# Patient Record
Sex: Female | Born: 1970 | Race: White | Hispanic: No | Marital: Single | Smoking: Current every day smoker
Health system: Southern US, Community
[De-identification: ages and names within clinical notes are randomized; demographics above are authoritative.]

## PROBLEM LIST (undated history)

## (undated) DIAGNOSIS — T8859XA Other complications of anesthesia, initial encounter: Secondary | ICD-10-CM

## (undated) DIAGNOSIS — C801 Malignant (primary) neoplasm, unspecified: Secondary | ICD-10-CM

## (undated) DIAGNOSIS — F32A Depression, unspecified: Secondary | ICD-10-CM

## (undated) DIAGNOSIS — M1990 Unspecified osteoarthritis, unspecified site: Secondary | ICD-10-CM

## (undated) DIAGNOSIS — F419 Anxiety disorder, unspecified: Secondary | ICD-10-CM

## (undated) DIAGNOSIS — I10 Essential (primary) hypertension: Secondary | ICD-10-CM

## (undated) DIAGNOSIS — I739 Peripheral vascular disease, unspecified: Secondary | ICD-10-CM

---

## 1998-03-07 HISTORY — PX: BREAST SURGERY: SHX581

## 2003-04-29 ENCOUNTER — Other Ambulatory Visit: Admission: RE | Admit: 2003-04-29 | Discharge: 2003-04-29 | Payer: Self-pay | Admitting: Obstetrics and Gynecology

## 2004-06-25 ENCOUNTER — Other Ambulatory Visit: Admission: RE | Admit: 2004-06-25 | Discharge: 2004-06-25 | Payer: Self-pay | Admitting: Obstetrics and Gynecology

## 2005-12-05 ENCOUNTER — Encounter: Admission: RE | Admit: 2005-12-05 | Discharge: 2005-12-05 | Payer: Self-pay | Admitting: Obstetrics and Gynecology

## 2008-07-21 ENCOUNTER — Encounter: Admission: RE | Admit: 2008-07-21 | Discharge: 2008-07-21 | Payer: Self-pay | Admitting: Obstetrics and Gynecology

## 2009-11-06 ENCOUNTER — Encounter: Admission: RE | Admit: 2009-11-06 | Discharge: 2009-11-06 | Payer: Self-pay | Admitting: Obstetrics and Gynecology

## 2011-10-07 ENCOUNTER — Other Ambulatory Visit: Payer: Self-pay | Admitting: Obstetrics and Gynecology

## 2011-10-07 DIAGNOSIS — N644 Mastodynia: Secondary | ICD-10-CM

## 2011-10-11 ENCOUNTER — Ambulatory Visit
Admission: RE | Admit: 2011-10-11 | Discharge: 2011-10-11 | Disposition: A | Discharge status: Home / Self Care | Payer: No Typology Code available for payment source | Source: Ambulatory Visit | Attending: Obstetrics and Gynecology | Admitting: Obstetrics and Gynecology

## 2011-10-11 DIAGNOSIS — N644 Mastodynia: Secondary | ICD-10-CM

## 2013-11-06 ENCOUNTER — Other Ambulatory Visit: Payer: Self-pay | Admitting: Obstetrics and Gynecology

## 2013-11-06 DIAGNOSIS — N644 Mastodynia: Secondary | ICD-10-CM

## 2013-11-07 ENCOUNTER — Other Ambulatory Visit: Payer: Self-pay | Admitting: Obstetrics and Gynecology

## 2013-11-07 DIAGNOSIS — Z9189 Other specified personal risk factors, not elsewhere classified: Secondary | ICD-10-CM

## 2013-11-07 DIAGNOSIS — Z803 Family history of malignant neoplasm of breast: Secondary | ICD-10-CM

## 2013-11-14 ENCOUNTER — Ambulatory Visit
Admission: RE | Admit: 2013-11-14 | Discharge: 2013-11-14 | Disposition: A | Discharge status: Home / Self Care | Payer: BC Managed Care – PPO | Source: Ambulatory Visit | Attending: Obstetrics and Gynecology | Admitting: Obstetrics and Gynecology

## 2013-11-14 ENCOUNTER — Other Ambulatory Visit: Payer: Self-pay | Admitting: Obstetrics and Gynecology

## 2013-11-14 DIAGNOSIS — N644 Mastodynia: Secondary | ICD-10-CM

## 2015-06-17 DIAGNOSIS — F339 Major depressive disorder, recurrent, unspecified: Secondary | ICD-10-CM | POA: Insufficient documentation

## 2015-06-17 DIAGNOSIS — E782 Mixed hyperlipidemia: Secondary | ICD-10-CM | POA: Insufficient documentation

## 2016-06-30 ENCOUNTER — Other Ambulatory Visit: Payer: Self-pay | Admitting: Obstetrics and Gynecology

## 2016-06-30 DIAGNOSIS — N644 Mastodynia: Secondary | ICD-10-CM

## 2016-07-04 ENCOUNTER — Ambulatory Visit
Admission: RE | Admit: 2016-07-04 | Discharge: 2016-07-04 | Disposition: A | Discharge status: Home / Self Care | Payer: BLUE CROSS/BLUE SHIELD | Source: Ambulatory Visit | Attending: Obstetrics and Gynecology | Admitting: Obstetrics and Gynecology

## 2016-07-04 ENCOUNTER — Other Ambulatory Visit: Payer: Self-pay | Admitting: Obstetrics and Gynecology

## 2016-07-04 DIAGNOSIS — N644 Mastodynia: Secondary | ICD-10-CM

## 2016-07-05 ENCOUNTER — Other Ambulatory Visit: Payer: Self-pay

## 2016-09-06 DIAGNOSIS — E876 Hypokalemia: Secondary | ICD-10-CM | POA: Insufficient documentation

## 2017-01-30 DIAGNOSIS — N182 Chronic kidney disease, stage 2 (mild): Secondary | ICD-10-CM | POA: Insufficient documentation

## 2017-05-23 ENCOUNTER — Encounter: Payer: Self-pay | Admitting: Gastroenterology

## 2017-12-05 DIAGNOSIS — N903 Dysplasia of vulva, unspecified: Secondary | ICD-10-CM | POA: Insufficient documentation

## 2018-11-05 IMAGING — MG 2D DIGITAL DIAGNOSTIC BILATERAL MAMMOGRAM WITH IMPLANTS, CAD AND
9 of 16 series · 9 of 32 positions shown · non-contrast
Comparison: Previous exam(s).

CLINICAL DATA: 45-year-old female with retroareolar and outer right
breast pain and for annual mammograms.

EXAM:
2D DIGITAL DIAGNOSTIC BILATERAL MAMMOGRAM WITH IMPLANTS, CAD AND
ADJUNCT TOMO
ULTRASOUND RIGHT BREAST
The patient has retropectoral implants. Standard and implant
displaced views were performed.

[L CC]
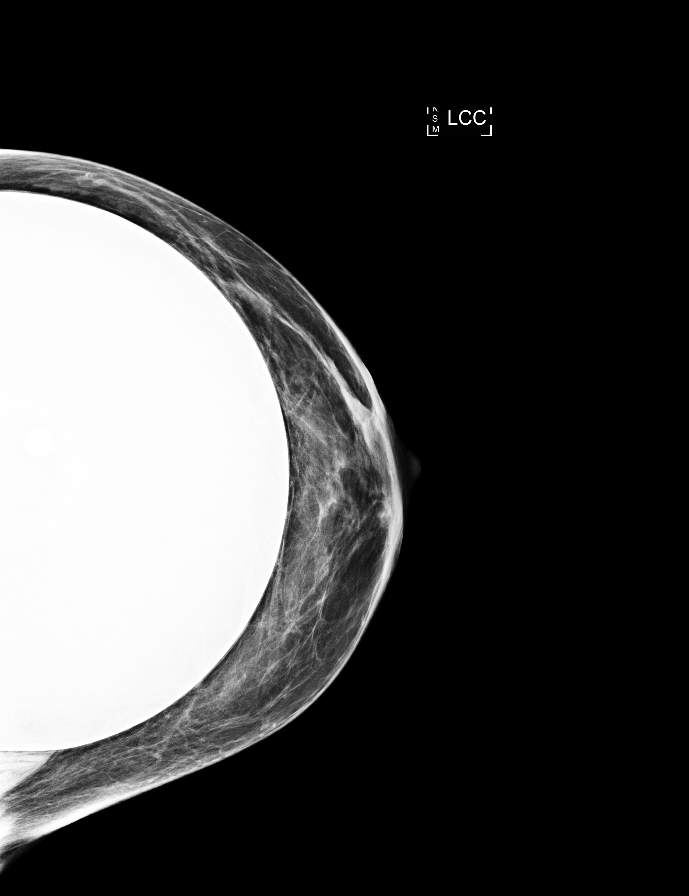

[L MLO (1 of 2)]
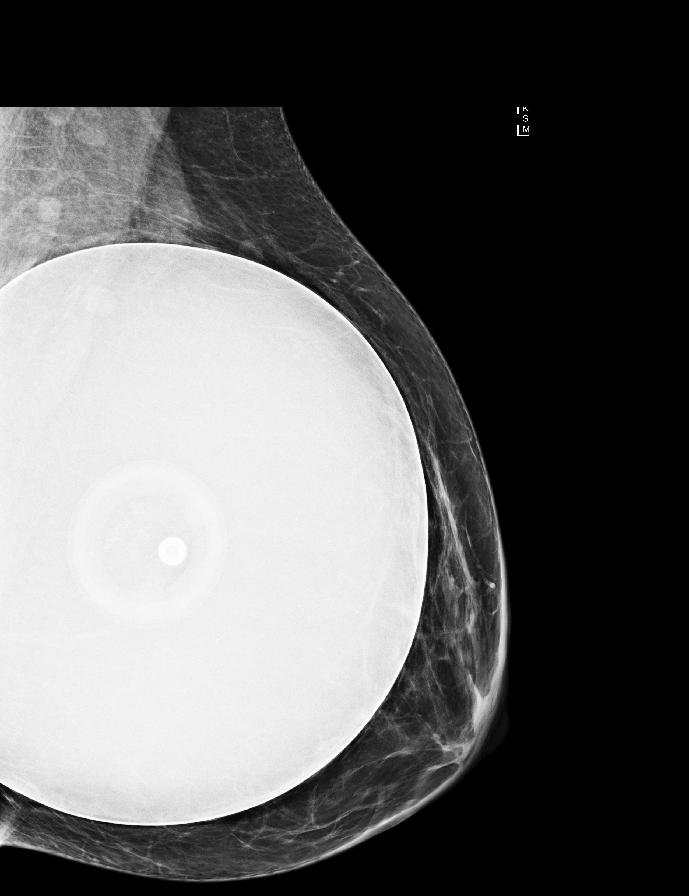

[R CC (1 of 2)]
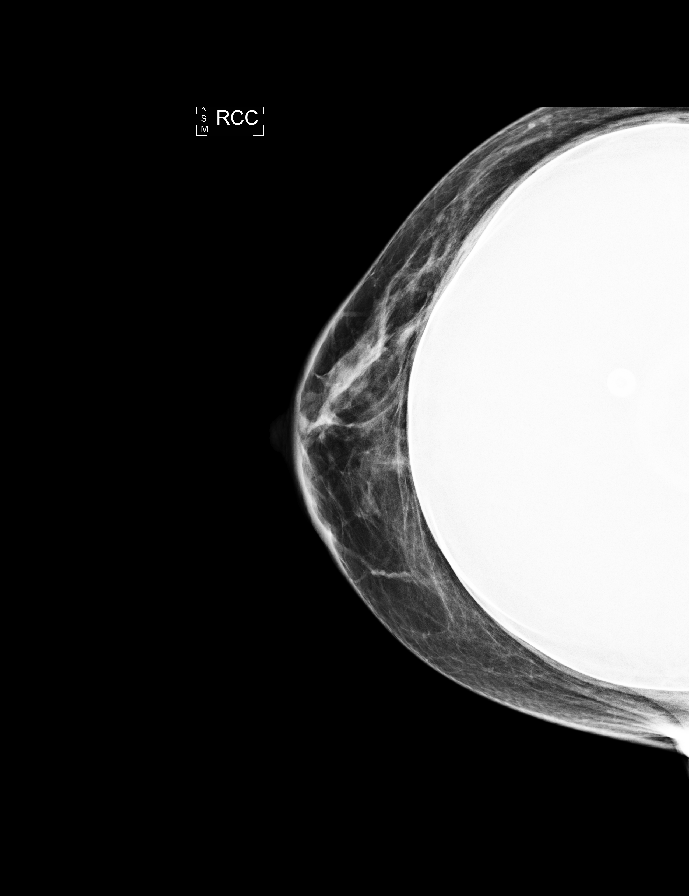

[R MLO (1 of 2)]
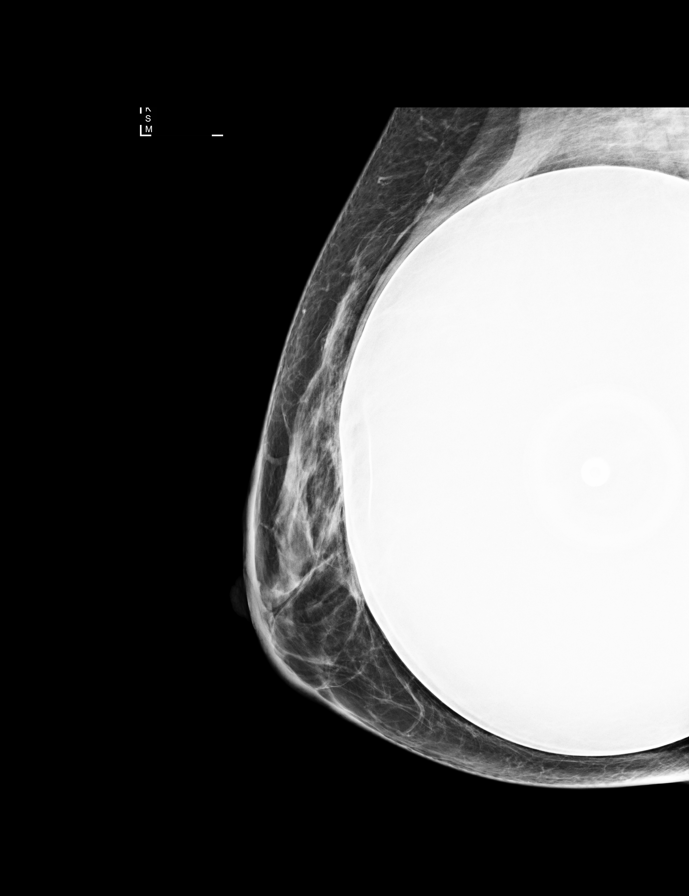

[R CC (2 of 2)]
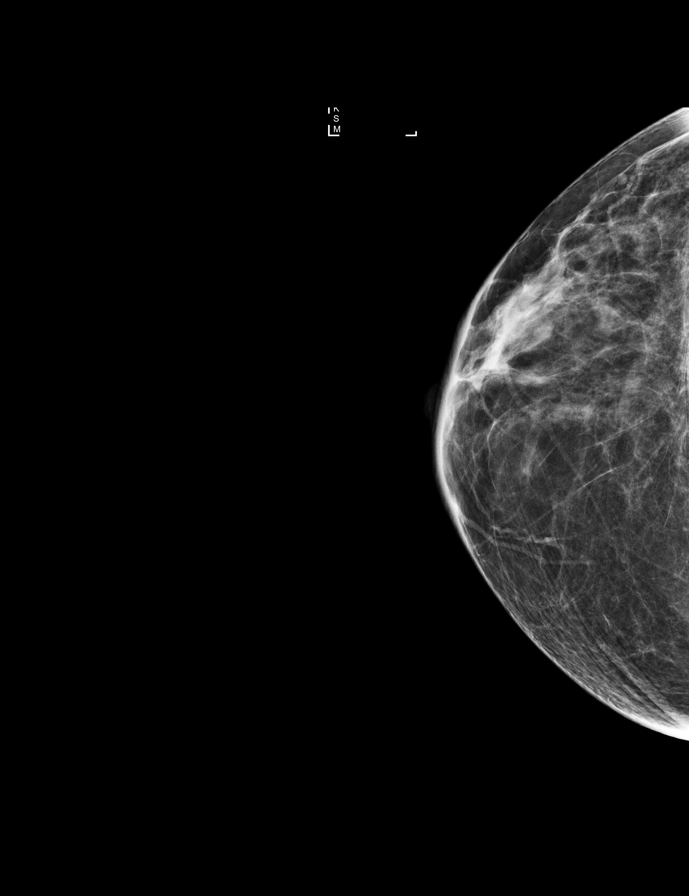

[R MLO synth-2D]
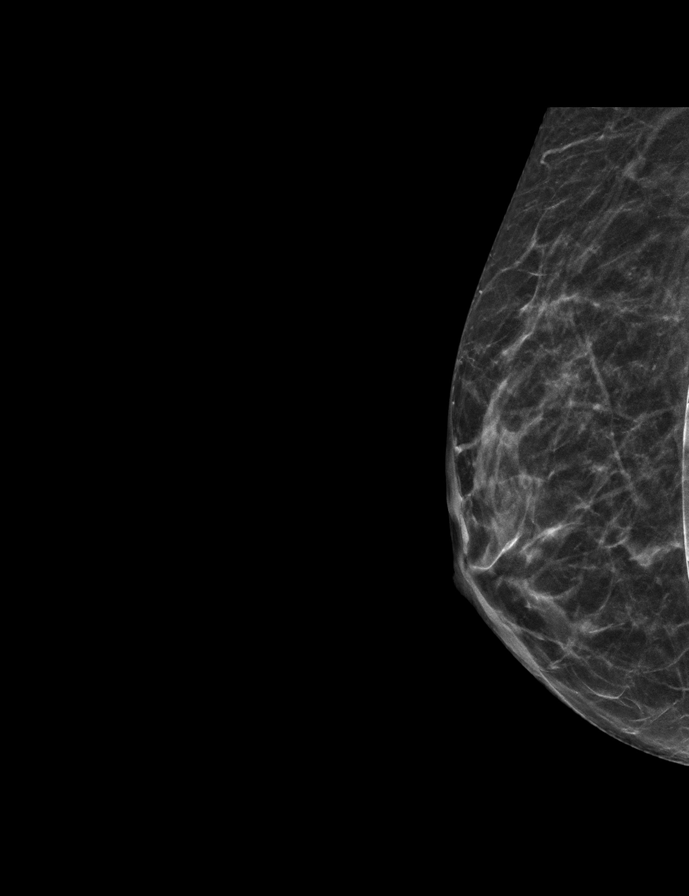

[R MLO (2 of 2)]
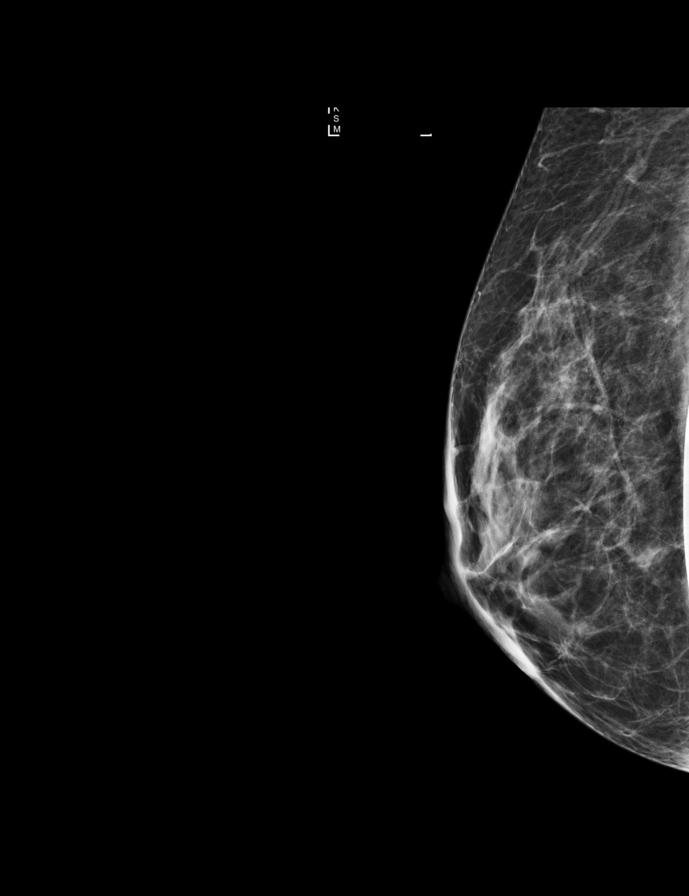

[L MLO (2 of 2)]
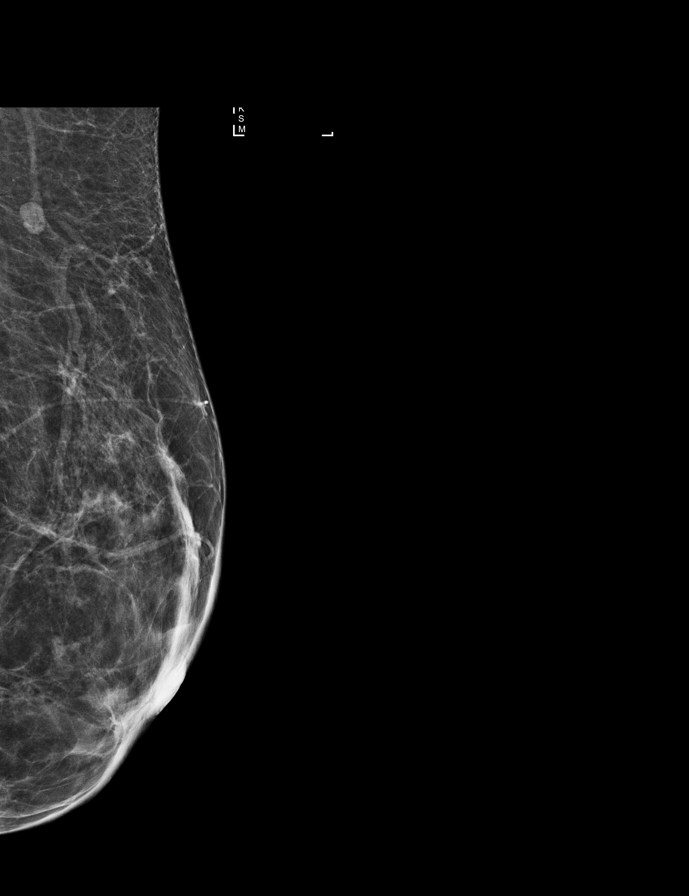

[R CC synth-2D]
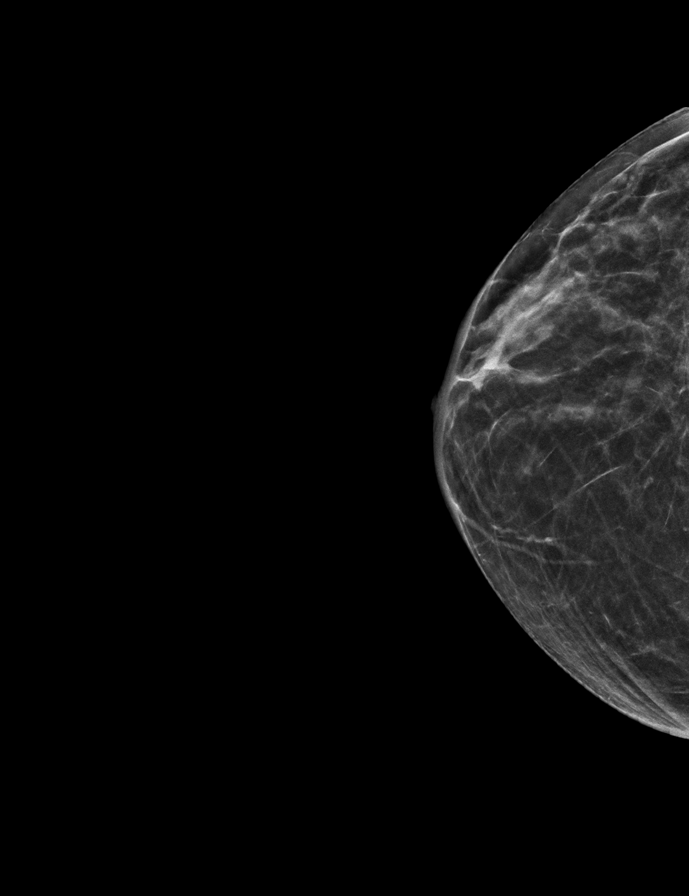

[9 of 32 positions shown; findings below may reference images not displayed]

ACR Breast Density Category b: There are scattered areas of
fibroglandular density.
FINDINGS: 2D and 3D full field views of both breasts demonstrate no suspicious
mass, distortion or worrisome calcifications.Mammographic images
were processed with CAD.

Targeted ultrasound is performed, showing no evidence of solid or
cystic mass, distortion or abnormal shadowing within the
retroareolar or outer right breast.
IMPRESSION: No mammographic or sonographic abnormality in the areas of patient's
pain.

No mammographic evidence of breast malignancy bilaterally.

RECOMMENDATION:
Bilateral screening mammograms in 1 year.

I have discussed the findings, causes of breast pain and
recommendations with the patient. Results were also provided in
writing at the conclusion of the visit. If applicable, a reminder
letter will be sent to the patient regarding the next appointment.

BI-RADS CATEGORY  1: Negative.

## 2019-03-08 HISTORY — PX: HEMIARTHROPLASTY HIP: SUR652

## 2019-07-02 ENCOUNTER — Other Ambulatory Visit: Payer: Self-pay | Admitting: Obstetrics and Gynecology

## 2019-07-02 DIAGNOSIS — N644 Mastodynia: Secondary | ICD-10-CM

## 2019-07-12 ENCOUNTER — Ambulatory Visit
Admission: RE | Admit: 2019-07-12 | Discharge: 2019-07-12 | Disposition: A | Discharge status: Home / Self Care | Payer: BLUE CROSS/BLUE SHIELD | Source: Ambulatory Visit | Attending: Obstetrics and Gynecology | Admitting: Obstetrics and Gynecology

## 2019-07-12 ENCOUNTER — Other Ambulatory Visit: Payer: Self-pay

## 2019-07-12 ENCOUNTER — Ambulatory Visit: Payer: BLUE CROSS/BLUE SHIELD

## 2019-07-12 ENCOUNTER — Other Ambulatory Visit: Payer: Self-pay | Admitting: Obstetrics and Gynecology

## 2019-07-12 DIAGNOSIS — N644 Mastodynia: Secondary | ICD-10-CM

## 2020-05-20 ENCOUNTER — Encounter: Payer: Self-pay | Admitting: Gastroenterology

## 2021-05-25 DIAGNOSIS — R87610 Atypical squamous cells of undetermined significance on cytologic smear of cervix (ASC-US): Secondary | ICD-10-CM | POA: Insufficient documentation

## 2021-11-30 ENCOUNTER — Other Ambulatory Visit: Payer: Self-pay | Admitting: Obstetrics and Gynecology

## 2021-11-30 DIAGNOSIS — N644 Mastodynia: Secondary | ICD-10-CM

## 2021-12-07 ENCOUNTER — Other Ambulatory Visit: Payer: Self-pay | Admitting: Obstetrics and Gynecology

## 2021-12-07 ENCOUNTER — Ambulatory Visit
Admission: RE | Admit: 2021-12-07 | Discharge: 2021-12-07 | Disposition: A | Discharge status: Home / Self Care | Payer: BC Managed Care – PPO | Source: Ambulatory Visit | Attending: Obstetrics and Gynecology | Admitting: Obstetrics and Gynecology

## 2021-12-07 DIAGNOSIS — N644 Mastodynia: Secondary | ICD-10-CM

## 2022-11-08 ENCOUNTER — Ambulatory Visit: Payer: Self-pay | Admitting: Podiatry

## 2023-08-22 DIAGNOSIS — E538 Deficiency of other specified B group vitamins: Secondary | ICD-10-CM | POA: Insufficient documentation

## 2023-10-12 ENCOUNTER — Encounter (HOSPITAL_COMMUNITY)

## 2023-12-06 NOTE — H&P (Cosign Needed Addendum)
 TOTAL HIP ADMISSION H&P  Patient is admitted for left total hip arthroplasty.  Subjective:  Chief Complaint: Left hip pain  HPI: Zoe Gordon, 53 y.o. female, has a history of pain and functional disability in the left hip due to arthritis and patient has failed non-surgical conservative treatments for greater than 12 weeks to include NSAID's and/or analgesics and activity modification. Onset of symptoms was gradual, starting 4 years ago with gradually worsening course since that time. The patient noted prior procedures of the hip to include hemi-arthroplasty on the left hip. Patient currently rates pain in the left hip at 8 out of 10 with activity. Patient has night pain, worsening of pain with activity and weight bearing, pain that interfers with activities of daily living, and pain with passive range of motion. Patient has evidence of subchondral cysts, periarticular osteophytes, and joint space narrowing by imaging studies. This condition presents safety issues increasing the risk of falls. This patient has had falilure of hemi-arthroplasty. There is no current active infection.  There are no active problems to display for this patient.   No past medical history on file.  No past surgical history on file.  Prior to Admission medications   Not on File    No Known Allergies  Social History   Socioeconomic History   Marital status: Single    Spouse name: Not on file   Number of children: Not on file   Years of education: Not on file   Highest education level: Not on file  Occupational History   Not on file  Tobacco Use   Smoking status: Not on file   Smokeless tobacco: Not on file  Substance and Sexual Activity   Alcohol use: Not on file   Drug use: Not on file   Sexual activity: Not on file  Other Topics Concern   Not on file  Social History Narrative   Not on file   Social Drivers of Health   Financial Resource Strain: Not on file  Food Insecurity: Not on file   Transportation Needs: Not on file  Physical Activity: Not on file  Stress: Not on file  Social Connections: Not on file  Intimate Partner Violence: Not on file    Tobacco Use: High Risk (08/09/2023)   Received from [facility]   Patient History    Smoking Tobacco Use: Every Day    Smokeless Tobacco Use: Never    Passive Exposure: Not on file   Social History   Substance and Sexual Activity  Alcohol Use Not on file    Family History  Problem Relation Age of Onset   Breast cancer Sister 54       once and 51 & again at 58    ROS   Objective:  Physical Exam: - Well-developed female alert and oriented in no apparent distress   - Evaluation of her left hip shows flexion to 120 degrees, rotation at 30 degrees, abduction 40 degrees with slight pain on range of motion.    IMAGING:   - Reviewing a plain radiograph, she has a left hip hemiarthroplasty. The acetabular cartilage is essentially gone, and the metal is against bone.   - Bone scan shows a tiny bit of increased uptake around the tip of the femoral stem. Otherwise, the femoral part looks normal.   - There is some increased uptake around the acetabulum on the left side.  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  The patient history,  physical examination, clinical judgement of the provider and imaging studies are consistent with end stage degenerative joint disease of the left hip with failed left hip hemiarthroplasty, and total hip arthroplasty is deemed medically necessary. The treatment options including medical management, injection therapy, and arthroplasty were discussed at length. The risks and benefits of total hip arthroplasty were presented and reviewed. The risks due to aseptic loosening, infection, stiffness, dislocation/subluxation, thromboembolic complications and other imponderables were discussed. The patient acknowledged the explanation, agreed to proceed with  the plan and consent was signed. Patient is being admitted for inpatient treatment for surgery, pain control, PT, OT, prophylactic antibiotics, VTE prophylaxis, progressive ambulation and ADLs and discharge planning.The patient is planning to be discharged home.   Patient's anticipated LOS is less than 2 midnights, meeting these requirements: - Younger than 70 - Lives within 1 hour of care - Has a competent adult at home to recover with post-op recover - NO history of  - Chronic pain requiring opiods  - Diabetes  - Coronary Artery Disease  - Heart failure  - Heart attack  - Stroke  - DVT/VTE  - Cardiac arrhythmia  - Respiratory Failure/COPD  - Renal failure  - Anemia  - Advanced Liver disease  Therapy Plans: HEP Disposition: Home with boyfriend Planned DVT Prophylaxis: Aspirin 81mg  BID DME Needed: None PCP: Cathlyn Nash, MD (clearance received) TXA: IV Allergies: codeine (itching), sulfa (itching), shellfish (eyes swollen shut), strawberries Anesthesia Concerns: Difficulty waking after endoscopy BMI: 23.4 Last HgbA1c: Not diabetic Pharmacy: [facility], [address]  Other: -Will plan for SDD unless femur needs to be revised -Complains of R gluteal fatigue/aching occasionally running down the thigh. Worse with prolonged ambulation. Negative SLR. Rx for medrol dosepak sent to her pharmacy. She has tolerated this medication before.   - Patient was instructed on what medications to stop prior to surgery. - Follow-up visit in 2 weeks with Dr. Melodi - Begin physical therapy following surgery - Pre-operative lab work as pre-surgical testing - Prescriptions will be provided in hospital at time of discharge  Corean Sender, PA-C Orthopedic Surgery [facility] [place]

## 2023-12-20 NOTE — Progress Notes (Signed)
 COVID Vaccine received:  [x]  No []  Yes Date of any COVID positive Test in last 90 days: none  PCP - Cathlyn Nash, MD, Melissa Patram, NP Clearance scanned to [employer]   [phone] (Work) [phone] (Fax)  Cardiologist - none   Chest x-ray -  EKG -  08-07-2023  CEW- [facility]- PCP request Stress Test -  ECHO -  Cardiac Cath -  CT Coronary Calcium score:   Pacemaker / ICD device [x]  No []  Yes   Spinal Cord Stimulator:[x]  No []  Yes       History of Sleep Apnea? [x]  No []  Yes   CPAP used?- [x]  No []  Yes    Patient has: [x]  NO Hx DM   []  Pre-DM   []  DM1  []   DM2 Does the patient monitor blood sugar?   [x]  N/A   []  No []  Yes  Last A1c was:  5.3  on   08-09-23      Blood Thinner / Instructions: none Aspirin Instructions:  none  Dental hx: []  Dentures:  []  N/A      []  Bridge or Partial:                   [x]  Loose or Damaged teeth:   Comments: Has MRI Brain and Spine scheduled for 12-22-23 at [facility] (ordered by PCP)  Dr. Posey scheduler Kirke is aware of this MRI and the possibility that this surgery may change.   Activity level: Able to walk up 2 flights of stairs without becoming significantly short of breath or having chest pain?  [x]  No   []    Yes  Patient can perform ADLs without assistance. []  No   [x]   Yes  Anesthesia review: HTN, MDD, Smoker  Patient denies any S&S of respiratory illness or Covid - no shortness of breath, fever, cough or chest pain at PAT appointment.  Patient verbalized understanding and agreement to the Pre-Surgical Instructions that were given to them at this PAT appointment. Patient was also educated of the need to review these PAT instructions again prior to her surgery.I reviewed the appropriate phone numbers to call if they have any and questions or concerns.

## 2023-12-20 NOTE — Patient Instructions (Signed)
 SURGICAL WAITING ROOM VISITATION Patients having surgery or a procedure may have no more than 2 support people in the waiting area - these visitors may rotate in the visitor waiting room.   If the patient needs to stay at the hospital during part of their recovery, the visitor guidelines for inpatient rooms apply.  PRE-OP VISITATION  Pre-op nurse will coordinate an appropriate time for 1 support person to accompany the patient in pre-op.  This support person may not rotate.  This visitor will be contacted when the time is appropriate for the visitor to come back in the pre-op area.  Please refer to the [facility] website for the visitor guidelines for Inpatients (after your surgery is over and you are in a regular room).  You are not required to quarantine at this time prior to your surgery. However, you must do this: Hand Hygiene often Do NOT share personal items Notify your provider if you are in close contact with someone who has COVID or you develop fever 100.4 or greater, new onset of sneezing, cough, sore throat, shortness of breath or body aches.  If you test positive for Covid or have been in contact with anyone that has tested positive in the last 10 days please notify you surgeon.    Your procedure is scheduled on:  Wednesday  January 03, 2024  Report to [facility] Main Entrance: [name] entrance where the [employer] is available.   Report to admitting at: 06:00    AM  Call this number if you have any questions or problems the morning of surgery [phone]  Do not eat food after Midnight the night prior to your surgery/procedure.  After Midnight you may have the following liquids until   05:30 AM DAY OF SURGERY  Clear Liquid Diet Water Black Coffee (sugar ok, NO MILK/CREAM OR CREAMERS)  Tea (sugar ok, NO MILK/CREAM OR CREAMERS) regular and decaf                             Plain Jell-O  with no fruit (NO RED)                                           Fruit  ices (not with fruit pulp, NO RED)                                     Popsicles (NO RED)                                                                  Juice: NO CITRUS JUICES: only apple, WHITE grape, WHITE cranberry Sports drinks like Gatorade or Powerade (NO RED)                 FOLLOW ANY ADDITIONAL PRE OP INSTRUCTIONS YOU RECEIVED FROM YOUR SURGEON'S OFFICE!!!   Oral Hygiene is also important to reduce your risk of infection.        Remember - BRUSH YOUR TEETH THE MORNING OF SURGERY WITH YOUR REGULAR TOOTHPASTE  Do NOT smoke after Midnight the night before surgery.  STOP TAKING all Vitamins, Herbs and supplements 1 week before your surgery.   Take ONLY these medicines the morning of surgery with A SIP OF WATER: Clonazepam if needed for anxiety. You may use your Flonase nasal spray.   DO NOT TAKE LOSARTAN the morning of surgery.                    You may not have any metal on your body including hair pins, jewelry, and body piercing  Do not wear make-up, lotions, powders, perfumes or deodorant  Do not wear nail polish including gel and S&S, artificial / acrylic nails, or any other type of covering on natural nails including finger and toenails. If you have artificial nails, gel coating, etc., that needs to be removed by a nail salon, Please have this removed prior to surgery. Not doing so may mean that your surgery could be cancelled or delayed if the Surgeon or anesthesia staff feels like they are unable to monitor you safely.   Do not shave 48 hours prior to surgery to avoid nicks in your skin which may contribute to postoperative infections.   Contacts, Hearing Aids, dentures or bridgework may not be worn into surgery. DENTURES WILL BE REMOVED PRIOR TO SURGERY PLEASE DO NOT APPLY Poly grip OR ADHESIVES!!!  You may bring a small overnight bag with you on the day of surgery, only pack items that are not valuable. [facility] IS NOT RESPONSIBLE   FOR VALUABLES THAT ARE LOST  OR STOLEN.   Do not bring your home medications to the hospital. The Pharmacy will dispense medications listed on your medication list to you during your admission in the Hospital.  Special Instructions: Bring a copy of your healthcare power of attorney and living will documents the day of surgery, if you wish to have them scanned into your [facility]- EPIC  Please read over the following fact sheets you were given: IF YOU HAVE QUESTIONS ABOUT YOUR PRE-OP INSTRUCTIONS, PLEASE CALL [phone].      Pre-operative 4 CHG Bath Instructions   You can play a key role in reducing the risk of infection after surgery. Your skin needs to be as free of germs as possible. You can reduce the number of germs on your skin by washing with CHG (chlorhexidine gluconate) soap before surgery. CHG is an antiseptic soap that kills germs and continues to kill germs even after washing.   DO NOT use if you have an allergy to chlorhexidine/CHG or antibacterial soaps. If your skin becomes reddened or irritated, stop using the CHG and notify one of our RNs at   Please shower with the CHG soap starting 4 days before surgery using the following schedule: SATURDAY  December 30, 2023    Please keep in mind the following:  DO NOT shave, including legs and underarms, starting the day of your first shower.   You may shave your face at any point before/day of surgery.  Place clean sheets on your bed the day you start using CHG soap. Use a clean washcloth (not used since being washed) for each shower. DO NOT sleep with pets once you start using the CHG.  CHG Shower Instructions:  If you choose to wash your hair and private area, wash first with your normal shampoo/soap.  After you use shampoo/soap, rinse your hair and body thoroughly to remove shampoo/soap residue.  Turn the water OFF and apply about  3 tablespoons (45 ml) of CHG soap to a CLEAN washcloth.  Apply CHG soap ONLY FROM YOUR NECK DOWN TO YOUR  TOES (washing for 3-5 minutes)  DO NOT use CHG soap on face, private areas, open wounds, or sores.  Pay special attention to the area where your surgery is being performed.  If you are having back surgery, having someone wash your back for you may be helpful. Wait 2 minutes after CHG soap is applied, then you may rinse off the CHG soap.  Pat dry with a clean towel  Put on clean clothes/pajamas   If you choose to wear lotion, please use ONLY the CHG-compatible lotions on the back of this paper.     Additional instructions for the day of surgery: DO NOT APPLY any CHG Soap,  lotions, deodorants, cologne, or perfumes on the day of surgery  Put on clean/comfortable clothes.  Brush your teeth.  Ask your nurse before applying any prescription medications to the skin.   CHG Compatible Lotions   Aveeno Moisturizing lotion  Cetaphil Moisturizing Cream  Cetaphil Moisturizing Lotion  Clairol Herbal Essence Moisturizing Lotion, Dry Skin  Clairol Herbal Essence Moisturizing Lotion, Extra Dry Skin  Clairol Herbal Essence Moisturizing Lotion, Normal Skin  Curel Age Defying Therapeutic Moisturizing Lotion with Alpha Hydroxy  Curel Extreme Care Body Lotion  Curel Soothing Hands Moisturizing Hand Lotion  Curel Therapeutic Moisturizing Cream, Fragrance-Free  Curel Therapeutic Moisturizing Lotion, Fragrance-Free  Curel Therapeutic Moisturizing Lotion, Original Formula  Eucerin Daily Replenishing Lotion  Eucerin Dry Skin Therapy Plus Alpha Hydroxy Crme  Eucerin Dry Skin Therapy Plus Alpha Hydroxy Lotion  Eucerin Original Crme  Eucerin Original Lotion  Eucerin Plus Crme Eucerin Plus Lotion  Eucerin TriLipid Replenishing Lotion  Keri Anti-Bacterial Hand Lotion  Keri Deep Conditioning Original Lotion Dry Skin Formula Softly Scented  Keri Deep Conditioning Original Lotion, Fragrance Free Sensitive Skin Formula  Keri Lotion Fast Absorbing Fragrance Free Sensitive Skin Formula  Keri Lotion Fast  Absorbing Softly Scented Dry Skin Formula  Keri Original Lotion  Keri Skin Renewal Lotion Keri Silky Smooth Lotion  Keri Silky Smooth Sensitive Skin Lotion  Nivea Body Creamy Conditioning Oil  Nivea Body Extra Enriched Lotion  Nivea Body Original Lotion  Nivea Body Sheer Moisturizing Lotion Nivea Crme  Nivea Skin Firming Lotion  NutraDerm 30 Skin Lotion  NutraDerm Skin Lotion  NutraDerm Therapeutic Skin Cream  NutraDerm Therapeutic Skin Lotion  ProShield Protective Hand Cream  Provon moisturizing lotion   FAILURE TO FOLLOW THESE INSTRUCTIONS MAY RESULT IN THE CANCELLATION OF YOUR SURGERY  PATIENT SIGNATURE_________________________________  NURSE SIGNATURE__________________________________  ________________________________________________________________________        Zoe Gordon    An incentive spirometer is a tool that can help keep your lungs clear and active. This tool measures how well you are filling your lungs with each breath. Taking long deep breaths may help reverse or decrease the chance of developing breathing (pulmonary) problems (especially infection) following: A long period of time when you are unable to move or be active. BEFORE THE PROCEDURE  If the spirometer includes an indicator to show your best effort, your nurse or respiratory therapist will set it to a desired goal. If possible, sit up straight or lean slightly forward. Try not to slouch. Hold the incentive spirometer in an upright position. INSTRUCTIONS FOR USE  Sit on the edge of your bed if possible, or sit up as far as you can in bed or on a chair. Hold the incentive spirometer in an  upright position. Breathe out normally. Place the mouthpiece in your mouth and seal your lips tightly around it. Breathe in slowly and as deeply as possible, raising the piston or the ball toward the top of the column. Hold your breath for 3-5 seconds or for as long as possible. Allow the piston or ball  to fall to the bottom of the column. Remove the mouthpiece from your mouth and breathe out normally. Rest for a few seconds and repeat Steps 1 through 7 at least 10 times every 1-2 hours when you are awake. Take your time and take a few normal breaths between deep breaths. The spirometer may include an indicator to show your best effort. Use the indicator as a goal to work toward during each repetition. After each set of 10 deep breaths, practice coughing to be sure your lungs are clear. If you have an incision (the cut made at the time of surgery), support your incision when coughing by placing a pillow or rolled up towels firmly against it. Once you are able to get out of bed, walk around indoors and cough well. You may stop using the incentive spirometer when instructed by your caregiver.  RISKS AND COMPLICATIONS Take your time so you do not get dizzy or light-headed. If you are in pain, you may need to take or ask for pain medication before doing incentive spirometry. It is harder to take a deep breath if you are having pain. AFTER USE Rest and breathe slowly and easily. It can be helpful to keep track of a log of your progress. Your caregiver can provide you with a simple table to help with this. If you are using the spirometer at home, follow these instructions: SEEK MEDICAL CARE IF:  You are having difficultly using the spirometer. You have trouble using the spirometer as often as instructed. Your pain medication is not giving enough relief while using the spirometer. You develop fever of 100.5 F (38.1 C) or higher.                                                                                                    SEEK IMMEDIATE MEDICAL CARE IF:  You cough up bloody sputum that had not been present before. You develop fever of 102 F (38.9 C) or greater. You develop worsening pain at or near the incision site. MAKE SURE YOU:  Understand these instructions. Will watch your  condition. Will get help right away if you are not doing well or get worse. Document Released: 07/04/2006 Document Revised: 05/16/2011 Document Reviewed: 09/04/2006 [facility] Patient Information 2014 [place], [place].         WHAT IS A BLOOD TRANSFUSION? Blood Transfusion Information  A transfusion is the replacement of blood or some of its parts. Blood is made up of multiple cells which provide different functions. Red blood cells carry oxygen and are used for blood loss replacement. White blood cells fight against infection. Platelets control bleeding. Plasma helps clot blood. Other blood products are available for specialized needs, such as hemophilia or other clotting disorders. BEFORE THE TRANSFUSION  Who gives blood for transfusions?  Healthy volunteers who are fully evaluated to make sure their blood is safe. This is blood bank blood. Transfusion therapy is the safest it has ever been in the practice of medicine. Before blood is taken from a donor, a complete history is taken to make sure that person has no history of diseases nor engages in risky social behavior (examples are intravenous drug use or sexual activity with multiple partners). The donor's travel history is screened to minimize risk of transmitting infections, such as malaria. The donated blood is tested for signs of infectious diseases, such as HIV and hepatitis. The blood is then tested to be sure it is compatible with you in order to minimize the chance of a transfusion reaction. If you or a relative donates blood, this is often done in anticipation of surgery and is not appropriate for emergency situations. It takes many days to process the donated blood. RISKS AND COMPLICATIONS Although transfusion therapy is very safe and saves many lives, the main dangers of transfusion include:  Getting an infectious disease. Developing a transfusion reaction. This is an allergic reaction to something in the blood you were given.  Every precaution is taken to prevent this. The decision to have a blood transfusion has been considered carefully by your caregiver before blood is given. Blood is not given unless the benefits outweigh the risks. AFTER THE TRANSFUSION Right after receiving a blood transfusion, you will usually feel much better and more energetic. This is especially true if your red blood cells have gotten low (anemic). The transfusion raises the level of the red blood cells which carry oxygen, and this usually causes an energy increase. The nurse administering the transfusion will monitor you carefully for complications. HOME CARE INSTRUCTIONS  No special instructions are needed after a transfusion. You may find your energy is better. Speak with your caregiver about any limitations on activity for underlying diseases you may have. SEEK MEDICAL CARE IF:  Your condition is not improving after your transfusion. You develop redness or irritation at the intravenous (IV) site. SEEK IMMEDIATE MEDICAL CARE IF:  Any of the following symptoms occur over the next 12 hours: Shaking chills. You have a temperature by mouth above 102 F (38.9 C), not controlled by medicine. Chest, back, or muscle pain. People around you feel you are not acting correctly or are confused. Shortness of breath or difficulty breathing. Dizziness and fainting. You get a rash or develop hives. You have a decrease in urine output. Your urine turns a dark color or changes to pink, red, or brown. Any of the following symptoms occur over the next 10 days: You have a temperature by mouth above 102 F (38.9 C), not controlled by medicine. Shortness of breath. Weakness after normal activity. The white part of the eye turns yellow (jaundice). You have a decrease in the amount of urine or are urinating less often. Your urine turns a dark color or changes to pink, red, or brown. Document Released: 02/19/2000 Document Revised: 05/16/2011 Document  Reviewed: 10/08/2007 [facility] Patient Information 2014 [place], [place].  _______________________________________________________________________          If you would like to see a video about joint replacement:   [email or web address]

## 2023-12-20 NOTE — Telephone Encounter (Signed)
 Called & scheduled

## 2023-12-20 NOTE — Telephone Encounter (Signed)
 Copied from CRM #[id number]. Topic: Clinical Concerns - Medical Question >> Dec 20, 2023  9:54 AM Massie DEL wrote: Zoe Gordon is calling other request    Include all details related to the request(s) below:  Patient called to to notify the office of recent [facility] orthopedic work up and findings. Reporting the MRI did not find any abnormalities of the spine and suggested that she move forward with a STAT neurology referral.   No preference as to who or where, [facility] facility that is best fit as recommended she asks.   Leg weakness has progressed patient mentioned, she would be interested in another round of steroids if possible. Requesting a clinical call back for confirmation. Patient is open to an appt if necessary.    Confirm and type the Best Contact Number below:  Patient/caller contact number:  [phone]      VM is full*     [] Home  [x] Mobile  [] Work  []  Other   []  Okay to leave a voicemail   Medication List:  Current Outpatient Medications:  .  albuterol HFA (PROVENTIL HFA;VENTOLIN HFA;PROAIR HFA) 90 mcg/actuation inhaler, Inhale 2 puffs every 4 (four) hours as needed for wheezing., Disp: 1 each, Rfl: 2 .  clonazePAM (KlonoPIN) 1 mg tablet, Take 1 mg by mouth every 12 (twelve) hours as needed for anxiety. Indications: psychiatric disorder, Disp: 60 tablet, Rfl: 5 .  fluticasone propionate (FLONASE) 50 mcg/spray nasal spray, Administer 1 spray into each nostril Once Daily., Disp: 1 Inhaler, Rfl: 1 .  furosemide (LASIX) 20 mg tablet, Take 1 tablet (20 mg total) by mouth daily., Disp: 90 tablet, Rfl: 3 .  losartan (COZAAR) 50 mg tablet, Take 1.5 tablets (75 mg total) by mouth daily., Disp: 135 tablet, Rfl: 3 .  predniSONE (DELTASONE) 20 mg tablet, Take one a day for 5 days than stop, Disp: 5 tablet, Rfl: 0 .  sertraline (ZOLOFT) 50 mg tablet, Take 50 mg by mouth Once Daily., Disp: 90 tablet, Rfl: 1 .  syringe with needle, safety (Monoject Safety Syringes) 3 mL 25 gauge x  5/8 syrg, 1,000 mcg by miscellaneous route Once Daily., Disp: 2 Syringe, Rfl: 1 .  syringe with needle, safety (Monoject Safety Syringes) 3 mL 25 gauge x 5/8 syrg, 1,000 mcg by miscellaneous route every 30 (thirty) days., Disp: 12 Syringe, Rfl: 11  Current Facility-Administered Medications:  .  cyanocobalamin (VITAMIN B12) injection 1,000 mcg, 1,000 mcg, intramuscular, Q30 Days, Melissa Joyce Brown Patram, NP, 1,000 mcg at 09/29/23 1120     Medication Request/Refills: Pharmacy Information (if applicable)   [x]  Not Applicable       []  Pharmacy listed  Send Medication Request to:                                                 []  Pharmacy not listed (added to pharmacy list in Epic) Send Medication Request to:      Listed Pharmacies: [id number] [place], [place] - [address] - PHONE: [phone] - FAX: [phone] [facility] [phone] - [place], [place] - [address] - PHONE: [phone] - FAX: [phone]

## 2023-12-20 NOTE — Telephone Encounter (Signed)
 Spoke with patient after speaking with Joyce. Zoe Gordon would like to order a MRI Cervical spine and MRI Brain. I have both of these approved and scheduled with [facility] on Friday 10/17 arrive at 2:45 PM. When I spoke with patient she stated that ortho is working a referral to neurology for her. She was unsure who they are referring her to and questioned if Zoe Gordon had someone she recommended. She will keep her appointment here with us  tomorrow.

## 2023-12-20 NOTE — Telephone Encounter (Signed)
 Copied from CRM #[id number]. Topic: Schedule Appointment - Schedule Patient >> Dec 20, 2023  1:23 PM Kate NOVAK wrote: Tanaya, Dunigan is calling other request    Include all details related to the request(s) below:  Right leg weakness---PCP aware & Ortho advised f/u with her tomorrow  Merlynn has several same day appointments tomorrow but I am unable to do anything. Could someone call patient and make an appointment with her tomorrow after 1:00 pm. Confirm and type the Best Contact Number below:  Patient/caller contact number: [phone]            [] Home  [x] Mobile  [] Work [] Other   [] Okay to leave a voicemail   Medication List:  Current Outpatient Medications:  .  albuterol HFA (PROVENTIL HFA;VENTOLIN HFA;PROAIR HFA) 90 mcg/actuation inhaler, Inhale 2 puffs every 4 (four) hours as needed for wheezing., Disp: 1 each, Rfl: 2 .  clonazePAM (KlonoPIN) 1 mg tablet, Take 1 mg by mouth every 12 (twelve) hours as needed for anxiety. Indications: psychiatric disorder, Disp: 60 tablet, Rfl: 5 .  fluticasone propionate (FLONASE) 50 mcg/spray nasal spray, Administer 1 spray into each nostril Once Daily., Disp: 1 Inhaler, Rfl: 1 .  furosemide (LASIX) 20 mg tablet, Take 1 tablet (20 mg total) by mouth daily., Disp: 90 tablet, Rfl: 3 .  losartan (COZAAR) 50 mg tablet, Take 1.5 tablets (75 mg total) by mouth daily., Disp: 135 tablet, Rfl: 3 .  predniSONE (DELTASONE) 20 mg tablet, Take one a day for 5 days than stop, Disp: 5 tablet, Rfl: 0 .  sertraline (ZOLOFT) 50 mg tablet, Take 50 mg by mouth Once Daily., Disp: 90 tablet, Rfl: 1 .  syringe with needle, safety (Monoject Safety Syringes) 3 mL 25 gauge x 5/8 syrg, 1,000 mcg by miscellaneous route Once Daily., Disp: 2 Syringe, Rfl: 1 .  syringe with needle, safety (Monoject Safety Syringes) 3 mL 25 gauge x 5/8 syrg, 1,000 mcg by miscellaneous route every 30 (thirty) days., Disp: 12 Syringe, Rfl: 11  Current Facility-Administered Medications:  .   cyanocobalamin (VITAMIN B12) injection 1,000 mcg, 1,000 mcg, intramuscular, Q30 Days, Melissa Joyce Brown Patram, NP, 1,000 mcg at 09/29/23 1120     Medication Request/Refills: Pharmacy Information (if applicable)   [x] Not Applicable       []  Pharmacy listed  Send Medication Request to:                                                 [] Pharmacy not listed (added to pharmacy list in Epic) Send Medication Request to:      Listed Pharmacies: [id number] [place], [place] - [address] - PHONE: [phone] - FAX: [phone] [facility] [phone] - [place], [place] - [address] OF [address] - PHONE: [phone] - FAX: [phone]

## 2023-12-21 ENCOUNTER — Other Ambulatory Visit: Payer: Self-pay

## 2023-12-21 ENCOUNTER — Encounter (HOSPITAL_COMMUNITY): Payer: Self-pay | Admitting: Physician Assistant

## 2023-12-21 ENCOUNTER — Encounter (HOSPITAL_COMMUNITY)
Admission: RE | Admit: 2023-12-21 | Discharge: 2023-12-21 | Disposition: A | Discharge status: Home / Self Care | Source: Ambulatory Visit | Attending: Orthopedic Surgery | Admitting: Orthopedic Surgery

## 2023-12-21 ENCOUNTER — Encounter (HOSPITAL_COMMUNITY): Payer: Self-pay

## 2023-12-21 VITALS — BP 128/58 | HR 77 | Temp 97.7°F | Resp 16 | Ht 66.0 in | Wt 137.0 lb

## 2023-12-21 DIAGNOSIS — I10 Essential (primary) hypertension: Secondary | ICD-10-CM | POA: Diagnosis not present

## 2023-12-21 DIAGNOSIS — M1612 Unilateral primary osteoarthritis, left hip: Secondary | ICD-10-CM | POA: Diagnosis not present

## 2023-12-21 DIAGNOSIS — Z79899 Other long term (current) drug therapy: Secondary | ICD-10-CM | POA: Diagnosis not present

## 2023-12-21 DIAGNOSIS — Z01818 Encounter for other preprocedural examination: Secondary | ICD-10-CM | POA: Diagnosis present

## 2023-12-21 DIAGNOSIS — Z01812 Encounter for preprocedural laboratory examination: Secondary | ICD-10-CM | POA: Diagnosis not present

## 2023-12-21 HISTORY — DX: Anxiety disorder, unspecified: F41.9

## 2023-12-21 HISTORY — DX: Depression, unspecified: F32.A

## 2023-12-21 HISTORY — DX: Unspecified osteoarthritis, unspecified site: M19.90

## 2023-12-21 HISTORY — DX: Malignant (primary) neoplasm, unspecified: C80.1

## 2023-12-21 HISTORY — DX: Essential (primary) hypertension: I10

## 2023-12-21 HISTORY — DX: Other complications of anesthesia, initial encounter: T88.59XA

## 2023-12-21 LAB — CBC
HCT: 44.2 % (ref 36.0–46.0)
Hemoglobin: 13.8 g/dL (ref 12.0–15.0)
MCH: 29.3 pg (ref 26.0–34.0)
MCHC: 31.2 g/dL (ref 30.0–36.0)
MCV: 93.8 fL (ref 80.0–100.0)
Platelets: 300 K/uL (ref 150–400)
RBC: 4.71 MIL/uL (ref 3.87–5.11)
RDW: 13.6 % (ref 11.5–15.5)
WBC: 10.5 K/uL (ref 4.0–10.5)
nRBC: 0 % (ref 0.0–0.2)

## 2023-12-21 LAB — COMPREHENSIVE METABOLIC PANEL WITH GFR
ALT: 10 U/L (ref 0–44)
AST: 14 U/L — ABNORMAL LOW (ref 15–41)
Albumin: 4.3 g/dL (ref 3.5–5.0)
Alkaline Phosphatase: 91 U/L (ref 38–126)
Anion gap: 10 (ref 5–15)
BUN: 11 mg/dL (ref 6–20)
CO2: 25 mmol/L (ref 22–32)
Calcium: 10.4 mg/dL — ABNORMAL HIGH (ref 8.9–10.3)
Chloride: 107 mmol/L (ref 98–111)
Creatinine, Ser: 0.97 mg/dL (ref 0.44–1.00)
GFR, Estimated: >60 mL/min (ref >60)
Glucose, Bld: 103 mg/dL — ABNORMAL HIGH (ref 70–99)
Potassium: 4.2 mmol/L (ref 3.5–5.1)
Sodium: 143 mmol/L (ref 135–145)
Total Bilirubin: 0.5 mg/dL (ref 0.0–1.2)
Total Protein: 6.9 g/dL (ref 6.5–8.1)

## 2023-12-21 LAB — TYPE AND SCREEN
ABO/RH(D): B POS
Antibody Screen: NEGATIVE

## 2023-12-21 LAB — SURGICAL PCR SCREEN
MRSA, PCR: NEGATIVE
Staphylococcus aureus: NEGATIVE

## 2023-12-22 NOTE — Progress Notes (Signed)
 Zoe Gordon is a 53 y.o.female.  Subjective:   This patient comes in as a follow-up for right lower extremity weakness she in fact had already seen the orthopedist who is planning to do orthopedic surgery on her left hip, they had gone ahead and ordered an MRI at their office the notes indicated they did not see any significant spinal stenosis that would explain any of the symptomatology that she was having and wanted to go ahead and make her a referral to neurology but the appointment that they made the person cannot see her until for several months and I had gone ahead and told her we would try to call around and see if we could find a neurology provider that would be able to see her sooner so I have put that referral in for her and we will have to wait and see what we can find out and will let her know I have also gone ahead and ordered an MRI of the brain as well as the cervical spine to see if there are any abnormalities.  She already had done quite a bit to look up before she came to the office visit convinced that there is MS and I reviewed with her I cannot tell her that is not part of the differential because it is she did not have a complaint of ascending weakness or numbness or inability to move it seemingly was more from the hip down.  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  Plan: This was mostly an educational visit and talking with her and trying to we doubt some of the symptomatology I do not  think we still really are going to know anything until we see an MRI of the brain as well as the C-spine she may ultimately need an MRI of the thoracic spine to be done she is not having Parkinson's-like symptoms at all there is no family history of MS that she is aware of.  She is quite nervous and anxious about this she already has the clonazepam but she has actually not had it filled in quite some time to consistently take it she does not always take it at night but she is not rested well for the last 3 nights so I have gone ahead and sent in a new prescription hopefully that will help a little bit for her the Ortho notes indicated she was actually quite hyperreflexive to the right patella and their exam.  We also did an updated vitamin B12 level Ortho had wanted to have that reviewed and it was improved  _________________________________    Diagnoses this Encounter 1. Neuralgia  Vitamin B12   Vitamin B12   clonazePAM (KlonoPIN) 1 mg tablet    2. History of left hip hemiarthroplasty      3. Left hip pain      4. Right leg weakness        Chief  Complaint  Patient presents with  . Extremity Weakness   Physical Exam Vitals reviewed.  Constitutional:      Appearance: Normal appearance.  HENT:     Head: Normocephalic.     Right Ear: Tympanic membrane normal.     Left Ear: Tympanic membrane normal.     Nose: No congestion or rhinorrhea.     Mouth/Throat:     Mouth: Mucous membranes are moist.  Eyes:     Pupils: Pupils are equal, round, and reactive to light.  Cardiovascular:     Rate and Rhythm: Normal rate and regular rhythm.     Pulses: Normal pulses.     Heart sounds: Normal heart sounds.  Pulmonary:     Effort: Pulmonary effort is normal.     Breath sounds: Normal breath sounds.  Abdominal:     Palpations: Abdomen is soft.  Musculoskeletal:        General: Normal range of motion.     Cervical back: Normal range of motion.  Skin:    General: Skin is warm and dry.      Capillary Refill: Capillary refill takes less than 2 seconds.  Neurological:     Mental Status: She is alert and oriented to person, place, and time.     Comments: Patient has a little bit of a slow gait she had difficulty with heel walking in the office  Psychiatric:        Mood and Affect: Mood normal.     ______________________ Vitals:   12/21/23 1541  BP: 136/76  Pulse: 78  Resp: 18  Temp: 98.3 F (36.8 C)  SpO2: 97%  Weight: 63.5 kg (140 lb)    Patient's Medications  New Prescriptions   No medications on file  Previous Medications   ALBUTEROL HFA (PROVENTIL HFA;VENTOLIN HFA;PROAIR HFA) 90 MCG/ACTUATION INHALER    Inhale 2 puffs every 4 (four) hours as needed for wheezing.   FLUTICASONE PROPIONATE (FLONASE) 50 MCG/SPRAY NASAL SPRAY    Administer 1 spray into each nostril Once Daily.   FUROSEMIDE (LASIX) 20 MG TABLET    Take 1 tablet (20 mg total) by mouth daily.   LOSARTAN (COZAAR) 50 MG TABLET    Take 1.5 tablets (75 mg total) by mouth daily.   PREDNISONE (DELTASONE) 20 MG TABLET    Take one a day for 5 days than stop   SERTRALINE (ZOLOFT) 50 MG TABLET    Take 50 mg by mouth Once Daily.   SYRINGE WITH NEEDLE, SAFETY (MONOJECT SAFETY SYRINGES) 3 ML 25 GAUGE X 5/8 SYRG    1,000 mcg by miscellaneous route Once Daily.   SYRINGE WITH NEEDLE, SAFETY (MONOJECT SAFETY SYRINGES) 3 ML 25 GAUGE X 5/8 SYRG    1,000 mcg by miscellaneous route every 30 (thirty) days.  Modified Medications   Modified Medication Previous Medication   CLONAZEPAM (KLONOPIN) 1 MG TABLET clonazePAM (KlonoPIN) 1 mg tablet      Take 1 tablet (1 mg total) by mouth every 12 (twelve) hours as needed for anxiety Indications: psychiatric disorder.    Take 1 mg by mouth every 12 (twelve) hours as needed for anxiety. Indications: psychiatric disorder  Discontinued Medications   No medications on file   Orders Placed This Encounter  Procedures  . Vitamin B12   Problem List[1] Medical History[2] Surgical  History[3] Family History[4] Social History   Socioeconomic History  . Marital status: Married    Spouse name: Not on file  . Number of children: Not on file  .  Years of education: Not on file  . Highest education level: Not on file  Occupational History  . Not on file  Tobacco Use  . Smoking status: Every Day  . Smokeless tobacco: Never  Substance and Sexual Activity  . Alcohol use: Yes  . Drug use: Not on file  . Sexual activity: Not on file  Other Topics Concern  . Not on file  Social History Narrative  . Not on file   Social Drivers of Health   Food Insecurity: Low Risk  (12/12/2023)   Food vital sign   . Within the past 12 months, you worried that your food would run out before you got money to buy more: Never true   . Within the past 12 months, the food you bought just didn't last and you didn't have money to get more: Never true  Transportation Needs: No Transportation Needs (12/12/2023)   Transportation   . In the past 12 months, has lack of reliable transportation kept you from medical appointments, meetings, work or from getting things needed for daily living? : No  Safety: Low Risk  (12/12/2023)   Safety   . How often does anyone, including family and friends, physically hurt you?: Never   . How often does anyone, including family and friends, insult or talk down to you?: Never   . How often does anyone, including family and friends, threaten you with harm?: Never   . How often does anyone, including family and friends, scream or curse at you?: Never  Living Situation: Low Risk  (12/12/2023)   Living Situation   . What is your living situation today?: I have a steady place to live   . Think about the place you live. Do you have problems with any of the following? Choose all that apply:: None/None on this list   Allergies[5]  Review of Systems  Constitutional:  Positive for fatigue. Negative for chills and fever.  HENT:  Negative for congestion, nosebleeds,  postnasal drip, rhinorrhea, sinus pressure and sore throat.   Respiratory:  Negative for chest tightness and shortness of breath.   Cardiovascular:  Negative for chest pain and palpitations.  Gastrointestinal:  Negative for blood in stool, constipation and nausea.  Genitourinary:  Negative for dysuria, flank pain, frequency and urgency.  Musculoskeletal:  Positive for arthralgias, gait problem and myalgias.  Skin:  Negative for color change.  Neurological:  Positive for weakness. Negative for dizziness and light-headedness.  Hematological:  Negative for adenopathy.  Psychiatric/Behavioral:  Negative for agitation.          [1] Patient Active Problem List Diagnosis  . Anxiety  . Carpal tunnel syndrome on right  . Chronic sinusitis  . Malaise and fatigue  . Mixed hyperlipidemia  . Lipoma  . Major depressive disorder, recurrent (HCC)  . Multinodular goiter  . Hypokalemia  . History of left hip hemiarthroplasty  . Primary hypertension  . Left hip pain  . B12 deficiency  . Right leg weakness  [2] Past Medical History: Diagnosis Date  . Allergy   . Chills   . Constipation   . Depression   . Excessive thirst   . Fatigue   . GERD (gastroesophageal reflux disease)   . Hair loss   . Hypercholesterolemia   . Intolerance to cold    and heat  . Itching   . Joint pain   . Menstrual abnormality   . Night sweats   . Swelling of both lower extremities   .  Swelling of both lower extremities   [3] Past Surgical History: Procedure Laterality Date  . BREAST SURGERY  2005   Procedure: BREAST SURGERY; augmentation  . TONSILLECTOMY  1976   Procedure: TONSILLECTOMY  . TONSILLECTOMY     Procedure: TONSILLECTOMY  [4] Family History Problem Relation Name Age of Onset  . Hypertension Mother    . Hypertension Father    . Breast cancer Sister    . Alzheimer's disease Other    . Seizures Other    . Migraines Other    . Parkinsonism Other    . Cancer Other    . Heart disease  Other    . Stroke Other    [5] Allergies Allergen Reactions  . Sulfur Dioxide Itching  . Codeine Itching  . Prednisone Swelling  . Sulfamethoxazole Itching

## 2023-12-25 ENCOUNTER — Observation Stay (HOSPITAL_COMMUNITY)
Admission: EM | Admit: 2023-12-25 | Discharge: 2023-12-27 | Disposition: A | Discharge status: Home / Self Care | Attending: Cardiovascular Disease | Admitting: Cardiovascular Disease

## 2023-12-25 ENCOUNTER — Other Ambulatory Visit: Payer: Self-pay

## 2023-12-25 ENCOUNTER — Observation Stay (HOSPITAL_COMMUNITY)

## 2023-12-25 ENCOUNTER — Encounter (HOSPITAL_COMMUNITY): Payer: Self-pay | Admitting: Family Medicine

## 2023-12-25 ENCOUNTER — Emergency Department (HOSPITAL_COMMUNITY)

## 2023-12-25 DIAGNOSIS — R200 Anesthesia of skin: Principal | ICD-10-CM | POA: Insufficient documentation

## 2023-12-25 DIAGNOSIS — Z7982 Long term (current) use of aspirin: Secondary | ICD-10-CM | POA: Diagnosis not present

## 2023-12-25 DIAGNOSIS — R29702 NIHSS score 2: Secondary | ICD-10-CM

## 2023-12-25 DIAGNOSIS — F17210 Nicotine dependence, cigarettes, uncomplicated: Secondary | ICD-10-CM | POA: Diagnosis not present

## 2023-12-25 DIAGNOSIS — R531 Weakness: Secondary | ICD-10-CM | POA: Diagnosis present

## 2023-12-25 DIAGNOSIS — Z79899 Other long term (current) drug therapy: Secondary | ICD-10-CM | POA: Insufficient documentation

## 2023-12-25 DIAGNOSIS — I639 Cerebral infarction, unspecified: Principal | ICD-10-CM | POA: Diagnosis present

## 2023-12-25 DIAGNOSIS — M21371 Foot drop, right foot: Secondary | ICD-10-CM | POA: Diagnosis not present

## 2023-12-25 DIAGNOSIS — F32A Depression, unspecified: Secondary | ICD-10-CM | POA: Diagnosis not present

## 2023-12-25 DIAGNOSIS — F419 Anxiety disorder, unspecified: Secondary | ICD-10-CM | POA: Insufficient documentation

## 2023-12-25 DIAGNOSIS — I700 Atherosclerosis of aorta: Secondary | ICD-10-CM | POA: Insufficient documentation

## 2023-12-25 DIAGNOSIS — I10 Essential (primary) hypertension: Secondary | ICD-10-CM | POA: Insufficient documentation

## 2023-12-25 DIAGNOSIS — Z85828 Personal history of other malignant neoplasm of skin: Secondary | ICD-10-CM | POA: Insufficient documentation

## 2023-12-25 LAB — CBC WITH DIFFERENTIAL/PLATELET
Abs Immature Granulocytes: 0.04 K/uL (ref 0.00–0.07)
Basophils Absolute: 0 K/uL (ref 0.0–0.1)
Basophils Relative: 0 %
Eosinophils Absolute: 0.1 K/uL (ref 0.0–0.5)
Eosinophils Relative: 1 %
HCT: 41.9 % (ref 36.0–46.0)
Hemoglobin: 13.8 g/dL (ref 12.0–15.0)
Immature Granulocytes: 1 %
Lymphocytes Relative: 21 %
Lymphs Abs: 1.8 K/uL (ref 0.7–4.0)
MCH: 30.1 pg (ref 26.0–34.0)
MCHC: 32.9 g/dL (ref 30.0–36.0)
MCV: 91.5 fL (ref 80.0–100.0)
Monocytes Absolute: 0.5 K/uL (ref 0.1–1.0)
Monocytes Relative: 6 %
Neutro Abs: 6.4 K/uL (ref 1.7–7.7)
Neutrophils Relative %: 71 %
Platelets: 271 K/uL (ref 150–400)
RBC: 4.58 MIL/uL (ref 3.87–5.11)
RDW: 13.6 % (ref 11.5–15.5)
WBC: 8.8 K/uL (ref 4.0–10.5)
nRBC: 0 % (ref 0.0–0.2)

## 2023-12-25 LAB — BASIC METABOLIC PANEL WITH GFR
Anion gap: 11 (ref 5–15)
BUN: 10 mg/dL (ref 6–20)
CO2: 26 mmol/L (ref 22–32)
Calcium: 9.7 mg/dL (ref 8.9–10.3)
Chloride: 103 mmol/L (ref 98–111)
Creatinine, Ser: 0.93 mg/dL (ref 0.44–1.00)
GFR, Estimated: >60 mL/min (ref >60)
Glucose, Bld: 122 mg/dL — ABNORMAL HIGH (ref 70–99)
Potassium: 3.8 mmol/L (ref 3.5–5.1)
Sodium: 140 mmol/L (ref 135–145)

## 2023-12-25 LAB — HCG, SERUM, QUALITATIVE: Preg, Serum: POSITIVE — AB

## 2023-12-25 LAB — HCG, QUANTITATIVE, PREGNANCY: hCG, Beta Chain, Quant, S: 4 m[IU]/mL (ref <5)

## 2023-12-25 MED ORDER — CLOPIDOGREL BISULFATE 75 MG PO TABS
75.0000 mg | ORAL_TABLET | Freq: Every day | ORAL | Status: DC
  Administered 2023-12-25 – 2023-12-27 (×3): 75 mg via ORAL
  Filled 2023-12-25 (×3): qty 1

## 2023-12-25 MED ORDER — ACETAMINOPHEN 650 MG RE SUPP: 650.0000 mg | RECTAL | Status: DC | PRN

## 2023-12-25 MED ORDER — ASPIRIN 81 MG PO TBEC
81.0000 mg | DELAYED_RELEASE_TABLET | Freq: Every day | ORAL | Status: DC
  Administered 2023-12-25 – 2023-12-27 (×3): 81 mg via ORAL
  Filled 2023-12-25 (×3): qty 1

## 2023-12-25 MED ORDER — SENNOSIDES-DOCUSATE SODIUM 8.6-50 MG PO TABS: 1.0000 | ORAL_TABLET | Freq: Every evening | ORAL | Status: DC | PRN

## 2023-12-25 MED ORDER — SODIUM CHLORIDE 0.9 % IV SOLN: INTRAVENOUS | Status: AC

## 2023-12-25 MED ORDER — IOHEXOL 350 MG/ML SOLN
75.0000 mL | Freq: Once | INTRAVENOUS | Status: AC | PRN
  Administered 2023-12-25: 75 mL via INTRAVENOUS

## 2023-12-25 MED ORDER — ENOXAPARIN SODIUM 40 MG/0.4ML IJ SOSY
40.0000 mg | PREFILLED_SYRINGE | INTRAMUSCULAR | Status: DC
  Administered 2023-12-25 – 2023-12-26 (×2): 40 mg via SUBCUTANEOUS
  Filled 2023-12-25 (×2): qty 0.4

## 2023-12-25 MED ORDER — ACETAMINOPHEN 325 MG PO TABS: 650.0000 mg | ORAL_TABLET | ORAL | Status: DC | PRN

## 2023-12-25 MED ORDER — STROKE: EARLY STAGES OF RECOVERY BOOK
Freq: Once | Status: AC
  Administered 2023-12-26: 1
  Filled 2023-12-25: qty 1

## 2023-12-25 MED ORDER — ACETAMINOPHEN 160 MG/5ML PO SOLN: 650.0000 mg | ORAL | Status: DC | PRN

## 2023-12-25 NOTE — ED Provider Notes (Signed)
 [place] EMERGENCY DEPARTMENT AT [facility] Provider Note   CSN: [id number] Arrival date & time: 12/25/23  1413     Patient presents with: Extremity Weakness   Zoe Gordon is a 53 y.o. female.    Extremity Weakness   Patient presents with extremity weakness.  Patient states that she started with right lower extremity weakness and foot drop approximately 1 week ago.  Today at noon, she felt like her symptoms were getting worse.  Once again, symptoms started a week ago.  She had MRI brain completed on October 17.  Report came back today.  Show possible left frontal infarct.  Subsequently presented to the ED for further evaluation.  Patient states that she has been having this foot drop on his right side as well as weakness on his right lower extremity for the past week.  Like once again, maybe got slightly worse today.  No vision changes.  Endorses some tingling on her right upper extremity as well as maybe some decreased grip strength in general I strength in the right upper extremity when compared to the left.  Takes a statin as as well as aspirin daily.  No history of CVA in the past.  No fever no chills.   Previous medical history reviewed : Patient saw primary care office on December 21, 2023.       Prior to Admission medications   Medication Sig Start Date End Date Taking? Authorizing Provider  clonazePAM (KLONOPIN) 1 MG tablet Take 1 mg by mouth daily as needed for anxiety. 07/04/16   [provider]  fluticasone (FLONASE) 50 MCG/ACT nasal spray Place 2 sprays into both nostrils daily.    [provider]  losartan (COZAAR) 50 MG tablet Take 25-50 mg by mouth See admin instructions. Take 25 mg by mouth in the morning and 50 mg in the evening 12/08/23 12/07/24  [provider]  meloxicam (MOBIC) 7.5 MG tablet Take 7.5 mg by mouth daily as needed for pain.    [provider]    Allergies: Sulfamethoxazole, Codeine, Shellfish  allergy, and Strawberry (diagnostic)    Review of Systems  Musculoskeletal:  Positive for extremity weakness.    Updated Vital Signs BP 134/63 (BP Location: Right Arm)   Pulse 83   Temp 98.3 F (36.8 C) (Oral)   Resp 14   Ht 5' 6 (1.676 m)   Wt 63.5 kg   LMP  (LMP Unknown)   SpO2 100%   BMI 22.60 kg/m   Physical Exam Vitals and nursing note reviewed.  Constitutional:      General: She is not in acute distress.    Appearance: She is well-developed.  HENT:     Head: Normocephalic and atraumatic.  Eyes:     General: No visual field deficit.    Conjunctiva/sclera: Conjunctivae normal.  Cardiovascular:     Rate and Rhythm: Normal rate and regular rhythm.     Heart sounds: No murmur heard. Pulmonary:     Effort: Pulmonary effort is normal. No respiratory distress.     Breath sounds: Normal breath sounds.  Abdominal:     Palpations: Abdomen is soft.     Tenderness: There is no abdominal tenderness.  Musculoskeletal:        General: No swelling.     Cervical back: Neck supple.  Skin:    General: Skin is warm and dry.     Capillary Refill: Capillary refill takes less than 2 seconds.  Neurological:  Mental Status: She is alert and oriented to person, place, and time.     GCS: GCS eye subscore is 4. GCS verbal subscore is 5. GCS motor subscore is 6.     Cranial Nerves: Cranial nerves 2-12 are intact. No cranial nerve deficit or dysarthria.     Sensory: Sensory deficit present.     Motor: No weakness.     Coordination: Coordination is intact.  Psychiatric:        Mood and Affect: Mood normal.     (all labs ordered are listed, but only abnormal results are displayed) Labs Reviewed  BASIC METABOLIC PANEL WITH GFR - Abnormal; Notable for the following components:      Result Value   Glucose, Bld 122 (*)    All other components within normal limits  HCG, SERUM, QUALITATIVE - Abnormal; Notable for the following components:   Preg, Serum POSITIVE (*)    All other  components within normal limits  CBC WITH DIFFERENTIAL/PLATELET  HCG, QUANTITATIVE, PREGNANCY  HIV ANTIBODY (ROUTINE TESTING W REFLEX)  LIPID PANEL  HEMOGLOBIN A1C  CBC  BASIC METABOLIC PANEL WITH GFR    EKG: None  Radiology: CT Head Wo Contrast Result Date: 12/25/2023 CLINICAL DATA:  Right leg weakness EXAM: CT HEAD WITHOUT CONTRAST TECHNIQUE: Contiguous axial images were obtained from the base of the skull through the vertex without intravenous contrast. RADIATION DOSE REDUCTION: This exam was performed according to the departmental dose-optimization program which includes automated exposure control, adjustment of the mA and/or kV according to patient size and/or use of iterative reconstruction technique. COMPARISON:  MRI 12/22/2023 FINDINGS: Brain: No hemorrhage or intracranial mass. Mild white matter hypodensity and focal hypodensity at the left high frontal cortex, series 3 image 27, in the region of previously noted MRI findings. No significant mass effect. The ventricles are nonenlarged Vascular: No hyperdense vessels.  Carotid vascular calcification Skull: Normal. Negative for fracture or focal lesion. Sinuses/Orbits: No acute finding. Other: None IMPRESSION: Mild white matter hypodensity and focal hypodensity at the left high frontal cortex, in the region of previously noted MRI findings, reference prior report. No significant mass effect. No hemorrhage. Electronically Signed   By: Luke Bun M.D.   On: 12/25/2023 15:38     Procedures   Medications Ordered in the ED   stroke: early stages of recovery book (has no administration in time range)  0.9 %  sodium chloride infusion (has no administration in time range)  acetaminophen (TYLENOL) tablet 650 mg (has no administration in time range)    Or  acetaminophen (TYLENOL) 160 MG/5ML solution 650 mg (has no administration in time range)    Or  acetaminophen (TYLENOL) suppository 650 mg (has no administration in time range)   senna-docusate (Senokot-S) tablet 1 tablet (has no administration in time range)  enoxaparin (LOVENOX) injection 40 mg (has no administration in time range)  aspirin EC tablet 81 mg (has no administration in time range)  clopidogrel (PLAVIX) tablet 75 mg (has no administration in time range)                      NIH Stroke Scale: 2              Medical Decision Making Risk Decision regarding hospitalization.     HPI:   Patient presents with extremity weakness.  Patient states that she started with right lower extremity weakness and foot drop approximately 1 week ago.  Today at noon, she felt like her symptoms  were getting worse.  Once again, symptoms started a week ago.  She had MRI brain completed on October 17.  Report came back today.  Show possible left frontal infarct.  Subsequently presented to the ED for further evaluation.  Patient states that she has been having this foot drop on his right side as well as weakness on his right lower extremity for the past week.  Like once again, maybe got slightly worse today.  No vision changes.  Endorses some tingling on her right upper extremity as well as maybe some decreased grip strength in general I strength in the right upper extremity when compared to the left.  Takes a statin as as well as aspirin daily.  No history of CVA in the past.  No fever no chills.   Previous medical history reviewed : Patient saw primary care office on December 21, 2023.   MDM:    Upon exam, patient ANO x 3 GCS 15.  Cranials 2 through 12 intact.  No focal deficit of the face.  Sensory and motor function of face intact.  No diplopia.  Patient has some sensory changes in the right upper extremity when compared to the left.  Sensory changes right lower extremity when compared to the left.  Maybe very minimal strength changes in the right upper extremity when compared to the left.  Patient has decreased strength in the right lower extremity when compared to the  left.  Patient has some drift against gravity on the right lower extremity.  Decreased pronation and supination strength in the right foot compared to the left foot.  I think this is all probably consistent for CVA based off of patient's report that it read on her phone from her MRI brain as well as her exam today.  No acute CVA concern at this time.  Not a candidate for TNK or thrombectomy.  No concerns for large vessel occlusion as time based off exam.  Will obtain with basic laboratory workup as well as CT head imaging.  Reevaluation:   Patient remains at same neuroexam status.  I did speak to neurology Dr. Voncile regarding the patient.  Recommended vessel imaging with CTA head and neck.  Recommends inpatient stroke workup with medicine as well.  He will consult on the patient.  CT head shows findings in the left frontal lobe consistent for likely CVA.  Subacute in nature most likely given patient's symptoms of at least a week.  Patient will be admitted to medicine.  No large electrolyte derangements.  Interventions:   EKG Interpreted by Me: NSR   Cardiac Tele Interpreted by Me: NSR   I have independently interpreted the CXR  and CT  images and agree with the radiologist finding   Social Determinant of Health: None    Disposition and Follow Up: admit       Final diagnoses:  Cerebrovascular accident (CVA), unspecified mechanism (HCC)  Weakness  Right foot drop    ED Discharge Orders     None          Simon Lavonia SAILOR, MD 12/25/23 2049

## 2023-12-25 NOTE — Consult Note (Signed)
 NEUROLOGY CONSULT NOTE   Date of service: December 25, 2023 Patient Name: Zoe Gordon MRN:  [id number] DOB:  09-12-1970 Chief Complaint: Right sided weakness Requesting Provider: Charlton Evalene RAMAN, MD  History of Present Illness  Zoe Gordon is a 53 y.o. female with hx of hypertension, hyperlipidemia, tobacco abuse presenting to the emergency department for evaluation of right-sided weakness that has been going on for a week.  She reports that for about 1 week she has noticed that her right foot would not move well-she describes right foot drop.  She also describes diminished sensation on the right hemibody and also weakness and tingling in the right upper extremity.  She now reports that since this morning, she is also had some left lower extremity tingling. She had an MRI done outpatient-unable to access images or results which she was able to access MR brain results from 12/22/2023 on her MyChart which showed a left frontal and frontoparietal areas of acute to subacute strokes. CT head done at our facility revealed mild white matter hypodensity and focal hypodensity at the left high frontal cortex.  LKW: 1 week ago Modified rankin score: 0-Completely asymptomatic and back to baseline post- stroke IV Thrombolysis: Outside the window EVT: Outside the the window  NIHSS components Score: Comment  1a Level of Conscious 0[x]  1[]  2[]  3[]      1b LOC Questions 0[x]  1[]  2[]       1c LOC Commands 0[x]  1[]  2[]       2 Best Gaze 0[x]  1[]  2[]       3 Visual 0[x]  1[]  2[]  3[]      4 Facial Palsy 0[x]  1[]  2[]  3[]      5a Motor Arm - left 0[x]  1[]  2[]  3[]  4[]  UN[]    5b Motor Arm - Right 0[x]  1[]  2[]  3[]  4[]  UN[]    6a Motor Leg - Left 0[x]  1[]  2[]  3[]  4[]  UN[]    6b Motor Leg - Right 0[]  1[x]  2[]  3[]  4[]  UN[]    7 Limb Ataxia 0[x]  1[]  2[]  UN[]      8 Sensory 0[]  1[x]  2[]  UN[]      9 Best Language 0[x]  1[]  2[]  3[]      10 Dysarthria 0[x]  1[]  2[]  UN[]      11 Extinct. and Inattention 0[x]  1[]  2[]        TOTAL: 2      ROS  Comprehensive ROS performed and pertinent positives documented in HPI  Past History   Past Medical History:  Diagnosis Date   Anxiety    Arthritis    Cancer (HCC)    basal cell lesion on abdomen   Complication of anesthesia    hard to wake up, very jittery   Depression    Hypertension     Past Surgical History:  Procedure Laterality Date   BREAST SURGERY Bilateral 2000   breast implants, augmentation  done at [facility]   HEMIARTHROPLASTY HIP Left 2021   done at [facility]GERY     bone graft to prepare for implant   TONSILLECTOMY     WISDOM TOOTH EXTRACTION      Family History: Family History  Problem Relation Age of Onset   Breast cancer Sister 42       once and 15 & again at 66    Social History  reports that she has been smoking cigarettes. She started smoking about 34 years ago. She has a 17.4 pack-year smoking history. She has never used smokeless tobacco. She reports current alcohol use. She reports that  she does not currently use drugs.  Allergies  Allergen Reactions   Sulfamethoxazole Itching   Codeine Itching   Shellfish Allergy Swelling    Eyes swell shut   Strawberry (Diagnostic) Hives    Medications   Current Facility-Administered Medications:    [START ON 12/26/2023]  stroke: early stages of recovery book, , Does not apply, Once, Opyd, Timothy S, MD   0.9 %  sodium chloride infusion, , Intravenous, Continuous, Opyd, Timothy S, MD   acetaminophen (TYLENOL) tablet 650 mg, 650 mg, Oral, Q4H PRN **OR** acetaminophen (TYLENOL) 160 MG/5ML solution 650 mg, 650 mg, Per Tube, Q4H PRN **OR** acetaminophen (TYLENOL) suppository 650 mg, 650 mg, Rectal, Q4H PRN, Opyd, Timothy S, MD   aspirin EC tablet 81 mg, 81 mg, Oral, Daily, Klover Priestly, MD   clopidogrel (PLAVIX) tablet 75 mg, 75 mg, Oral, Daily, Madaleine Simmon, MD   enoxaparin (LOVENOX) injection 40 mg, 40 mg, Subcutaneous, Q24H, Opyd, Timothy S, MD   senna-docusate  (Senokot-S) tablet 1 tablet, 1 tablet, Oral, QHS PRN, Opyd, Evalene RAMAN, MD  Current Outpatient Medications:    clonazePAM (KLONOPIN) 1 MG tablet, Take 1 mg by mouth daily as needed for anxiety., Disp: , Rfl:    fluticasone (FLONASE) 50 MCG/ACT nasal spray, Place 2 sprays into both nostrils daily., Disp: , Rfl:    losartan (COZAAR) 50 MG tablet, Take 25-50 mg by mouth See admin instructions. Take 25 mg by mouth in the morning and 50 mg in the evening, Disp: , Rfl:    meloxicam (MOBIC) 7.5 MG tablet, Take 7.5 mg by mouth daily as needed for pain., Disp: , Rfl:   Vitals   Vitals:   12/25/23 1419 12/25/23 1426 12/25/23 1822  BP: (!) 136/58  134/63  Pulse: (!) 105  83  Resp: 18  14  Temp: 98.3 F (36.8 C)  98.3 F (36.8 C)  TempSrc: Oral  Oral  SpO2: 99%  100%  Weight:  63.5 kg   Height:  5' 6 (1.676 m)     Body mass index is 22.6 kg/m.   Physical Exam   Constitutional: Appears well-developed and well-nourished.  Psych: Affect appropriate to situation.  Eyes: No scleral injection.  HENT: No OP obstruction.  Head: Normocephalic.  Cardiovascular: Normal rate and regular rhythm.  Respiratory: Effort normal, non-labored breathing.  GI: Soft.  No distension. There is no tenderness.  Skin: WDI.   Neurologic Examination  Awake alert oriented x 3.  No dysarthria.  No aphasia. Cranial nerves II to XII intact Motor examination with mild drift in the right lower extremity on raise leg.  She has a right foot drop-dorsiflexion and plantarflexion are barely 1/5 at best. Sensation diminished on the right hemibody to light touch. Coordination examination reveals no dysmetria Gait testing deferred at this time  Labs/Imaging/Neurodiagnostic studies   CBC:  Recent Labs  Lab 2023/12/24 0907 12/25/23 1447  WBC 10.5 8.8  NEUTROABS  --  6.4  HGB 13.8 13.8  HCT 44.2 41.9  MCV 93.8 91.5  PLT 300 271   Basic Metabolic Panel:  Lab Results  Component Value Date   NA 140 12/25/2023   K  3.8 12/25/2023   CO2 26 12/25/2023   GLUCOSE 122 (H) 12/25/2023   BUN 10 12/25/2023   CREATININE 0.93 12/25/2023   CALCIUM 9.7 12/25/2023   GFRNONAA >60 12/25/2023     CT Head without contrast(Personally reviewed): White matter hypodensity and focal hypodensity in the left high frontal cortex  ASSESSMENT  Zoe Gordon is a 53 y.o. female past history of hypertension, hyperlipidemia and tobacco abuse presenting for 1 week worth of right sided weakness.  Outside imaging concerning for left hemispheric strokes-imaging and report not available in our system to review.  Impression: Acute ischemic infarct-etiology under investigation  RECOMMENDATIONS  Admit to hospitalist Frequent neurochecks Telemetry Attempt obtain outside images by radiology in the morning through [employer] Aspirin 81+ Plavix 75 CTA head and neck Duration of DAPT to be determined after CTA head and neck and stroke team evaluation A1c Lipid panel No need for permissive hypertension Therapy assessments Stroke team to follow Plan discussed with Dr. Charlton ______________________________________________________________________    Signed, Eligio Lav, MD [facility]

## 2023-12-25 NOTE — ED Triage Notes (Signed)
 Patient arrives via [facility] for right leg weakness from work. Patient was dx with foot drop x 1 week ago. Patient says weakness is worse today. Endorses lightheadedness, intermittent weakness, and shob.   EMS vitals BP  160/80 HR 104 sinus arrhythmia CBG 153 O2 100

## 2023-12-25 NOTE — ED Provider Triage Note (Signed)
 Emergency Medicine Provider Triage Evaluation Note  Zoe Gordon , a 53 y.o. female  was evaluated in triage.  Pt complains of right leg weakness that has been progressing over the past week.  She reports that initially she send of left foot drop in her right foot, and the right leg has progressively gotten more weak.  She also reports some numbness in her right foot.  Denies any injuries.  Denies any slurred speech, facial droop, dizziness, arm weakness or numbness.  She had a MRI brain performed as an outpatient, but has not reviewed the results yet with her PCP.  Review of Systems  Positive: As above Negative: As above  Physical Exam  BP (!) 136/58 (BP Location: Right Arm)   Pulse (!) 105   Temp 98.3 F (36.8 C) (Oral)   Resp 18   Ht 5' 6 (1.676 m)   Wt 63.5 kg   LMP  (LMP Unknown)   SpO2 99%   BMI 22.60 kg/m  Gen:   Awake, no distress   Resp:  Normal effort  MSK:   Moves extremities without difficulty   1+ strength in right lower extremity compared to 5/5 strength in the left lower extremity.  Intact sensation in the bilateral lower extremities.  Symmetric smile, puff cheeks, and raises eyebrows symmetrically.  Talks without difficulty with word finding.  5/5 strength in the bilateral upper extremities intact sensation in the bilateral upper extremities  Medical Decision Making  Medically screening exam initiated at 2:40 PM.  Appropriate orders placed.  Aslan Montagna was informed that the remainder of the evaluation will be completed by another provider, this initial triage assessment does not replace that evaluation, and the importance of remaining in the ED until their evaluation is complete.     Veta Palma, PA-C 12/25/23 1440

## 2023-12-25 NOTE — ED Notes (Signed)
 Patient transported to MRI

## 2023-12-25 NOTE — H&P (Signed)
 History and Physical    Zoe Gordon FMW:[id number] DOB: 08-Oct-1970 DOA: 12/25/2023  PCP: Benson Eleanor Rung, NP   Patient coming from: Home   Chief Complaint: right leg weakness   HPI: Zoe Gordon is a 53 y.o. female with medical history significant for hypertension, depression, and anxiety who presents with right leg weakness.  Patient reports 1 week of right leg weakness (though clinic note from 12/12/2023 notes that she was complaining of it then).  She also reports some numbness and tingling in the right leg as well as numbness involving the right hand.  Per patient report, MRI of the lumbar spine and brain were performed last week and she was called with results today.  Patient states that the MRI brain was concerning for stroke while her friend at the bedside states that he thought they told her it was MS.  Patient denies any recent chest pain, palpitations, fevers, or chills.  ED Course: Upon arrival to the ED, patient is found to be afebrile and saturating well on room air with stable BP.  Head CT demonstrates mild white matter hypodensity in the left frontal cortex.  Neurology was consulted by the ED physician and CTA of the head and neck was ordered.  Review of Systems:  All other systems reviewed and apart from HPI, are negative.  Past Medical History:  Diagnosis Date   Anxiety    Arthritis    Cancer (HCC)    basal cell lesion on abdomen   Complication of anesthesia    hard to wake up, very jittery   Depression    Hypertension     Past Surgical History:  Procedure Laterality Date   BREAST SURGERY Bilateral 2000   breast implants, augmentation  done at [facility]   HEMIARTHROPLASTY HIP Left 2021   done at [facility] SURGERY     bone graft to prepare for implant   TONSILLECTOMY     WISDOM TOOTH EXTRACTION      Social History:   reports that she has been smoking cigarettes. She started smoking about 34 years ago. She has a 17.4  pack-year smoking history. She has never used smokeless tobacco. She reports current alcohol use. She reports that she does not currently use drugs.  Allergies  Allergen Reactions   Sulfamethoxazole Itching   Codeine Itching   Shellfish Allergy Swelling    Eyes swell shut   Strawberry (Diagnostic) Hives    Family History  Problem Relation Age of Onset   Breast cancer Sister 59       once and 12 & again at 1     Prior to Admission medications   Medication Sig Start Date End Date Taking? Authorizing Provider  clonazePAM (KLONOPIN) 1 MG tablet Take 1 mg by mouth daily as needed for anxiety. 07/04/16   [provider]  fluticasone (FLONASE) 50 MCG/ACT nasal spray Place 2 sprays into both nostrils daily.    [provider]  losartan (COZAAR) 50 MG tablet Take 25-50 mg by mouth See admin instructions. Take 25 mg by mouth in the morning and 50 mg in the evening 12/08/23 12/07/24  [provider]  meloxicam (MOBIC) 7.5 MG tablet Take 7.5 mg by mouth daily as needed for pain.    [provider]    Physical Exam: Vitals:   12/25/23 1419 12/25/23 1426 12/25/23 1822  BP: (!) 136/58  134/63  Pulse: (!) 105  83  Resp: 18  14  Temp: 98.3 F (36.8  C)  98.3 F (36.8 C)  TempSrc: Oral  Oral  SpO2: 99%  100%  Weight:  63.5 kg   Height:  5' 6 (1.676 m)      Constitutional: NAD, no pallor or diaphoresis   Eyes: PERTLA, lids and conjunctivae normal ENMT: Mucous membranes are moist. Posterior pharynx clear of any exudate or lesions.   Neck: supple, no masses  Respiratory: no wheezing, no crackles. No accessory muscle use.  Cardiovascular: S1 & S2 heard, regular rate and rhythm. No extremity edema.  Abdomen: No tenderness, soft. Bowel sounds active.  Musculoskeletal: no clubbing / cyanosis. No joint deformity upper and lower extremities.   Skin: no significant rashes, lesions, ulcers. Warm, dry, well-perfused. Neurologic: CN 2-12 grossly intact. Sensation  to light touch diminished on right. Distal RLE strength 1/5, strength 5/5 elsewhere. Alert and oriented.  Psychiatric: Calm. Cooperative.    Labs and Imaging on Admission: I have personally reviewed following labs and imaging studies  CBC: Recent Labs  Lab 12/21/23 0907 12/25/23 1447  WBC 10.5 8.8  NEUTROABS  --  6.4  HGB 13.8 13.8  HCT 44.2 41.9  MCV 93.8 91.5  PLT 300 271   Basic Metabolic Panel: Recent Labs  Lab 12/21/23 0907 12/25/23 1447  NA 143 140  K 4.2 3.8  CL 107 103  CO2 25 26  GLUCOSE 103* 122*  BUN 11 10  CREATININE 0.97 0.93  CALCIUM 10.4* 9.7   GFR: Estimated Creatinine Clearance: 64.7 mL/min (by C-G formula based on SCr of 0.93 mg/dL). Liver Function Tests: Recent Labs  Lab 12/21/23 0907  AST 14*  ALT 10  ALKPHOS 91  BILITOT 0.5  PROT 6.9  ALBUMIN 4.3   No results for input(s): LIPASE, AMYLASE in the last 168 hours. No results for input(s): AMMONIA in the last 168 hours. Coagulation Profile: No results for input(s): INR, PROTIME in the last 168 hours. Cardiac Enzymes: No results for input(s): CKTOTAL, CKMB, CKMBINDEX, TROPONINI in the last 168 hours. BNP (last 3 results) No results for input(s): PROBNP in the last 8760 hours. HbA1C: No results for input(s): HGBA1C in the last 72 hours. CBG: No results for input(s): GLUCAP in the last 168 hours. Lipid Profile: No results for input(s): CHOL, HDL, LDLCALC, TRIG, CHOLHDL, LDLDIRECT in the last 72 hours. Thyroid Function Tests: No results for input(s): TSH, T4TOTAL, FREET4, T3FREE, THYROIDAB in the last 72 hours. Anemia Panel: No results for input(s): VITAMINB12, FOLATE, FERRITIN, TIBC, IRON, RETICCTPCT in the last 72 hours. Urine analysis: No results found for: COLORURINE, APPEARANCEUR, LABSPEC, PHURINE, GLUCOSEU, HGBUR, BILIRUBINUR, KETONESUR, PROTEINUR, UROBILINOGEN, NITRITE, LEUKOCYTESUR Sepsis  Labs: @LABRCNTIP (procalcitonin:4,lacticidven:4) ) Recent Results (from the past 240 hours)  Surgical pcr screen     Status: None   Collection Time: 12/21/23  9:07 AM   Specimen: Nasal Mucosa; Nasal Swab  Result Value Ref Range Status   MRSA, PCR NEGATIVE NEGATIVE Final   Staphylococcus aureus NEGATIVE NEGATIVE Final    Comment: (NOTE) The Xpert SA Assay (FDA approved for NASAL specimens in patients 3 years of age and older), is one component of a comprehensive surveillance program. It is not intended to diagnose infection nor to guide or monitor treatment. Performed at [facility], [address], [place], [place] [address]      Radiological Exams on Admission: CT Head Wo Contrast Result Date: 12/25/2023 CLINICAL DATA:  Right leg weakness EXAM: CT HEAD WITHOUT CONTRAST TECHNIQUE: Contiguous axial images were obtained from the base of the skull through  the vertex without intravenous contrast. RADIATION DOSE REDUCTION: This exam was performed according to the departmental dose-optimization program which includes automated exposure control, adjustment of the mA and/or kV according to patient size and/or use of iterative reconstruction technique. COMPARISON:  MRI 12/22/2023 FINDINGS: Brain: No hemorrhage or intracranial mass. Mild white matter hypodensity and focal hypodensity at the left high frontal cortex, series 3 image 27, in the region of previously noted MRI findings. No significant mass effect. The ventricles are nonenlarged Vascular: No hyperdense vessels.  Carotid vascular calcification Skull: Normal. Negative for fracture or focal lesion. Sinuses/Orbits: No acute finding. Other: None IMPRESSION: Mild white matter hypodensity and focal hypodensity at the left high frontal cortex, in the region of previously noted MRI findings, reference prior report. No significant mass effect. No hemorrhage. Electronically Signed   By: Luke Bun M.D.   On: 12/25/2023 15:38     Assessment/Plan   1. CVA   - Outside imaging concerning for stroke; sxs present for at least 1 wk  - Check EKG, continue cardiac monitoring and neurochecks, start ASA 81 and Plavix 75, check CTA head and neck, A1c, and lipids, consult PT/OT/SLP    2. Hypertension, depression, anxiety   - Follow-up pharmacy medication reconciliation and continue home medications as appropriate     DVT prophylaxis: Lovenox  Code Status: Full  Level of Care: Level of care: Telemetry Medical Family Communication: Friends at bedside  Disposition Plan:  Patient is from: Home  Anticipated d/c is to: Home Anticipated d/c date is: 12/26/23  Patient currently: Pending additional workup  Consults called: Neurology  Admission status: Observation     Evalene GORMAN Sprinkles, MD [facility]  12/25/2023, 8:46 PM

## 2023-12-26 ENCOUNTER — Ambulatory Visit: Admitting: Diagnostic Neuroimaging

## 2023-12-26 ENCOUNTER — Observation Stay (HOSPITAL_COMMUNITY)

## 2023-12-26 DIAGNOSIS — R29702 NIHSS score 2: Secondary | ICD-10-CM | POA: Diagnosis not present

## 2023-12-26 DIAGNOSIS — E785 Hyperlipidemia, unspecified: Secondary | ICD-10-CM

## 2023-12-26 DIAGNOSIS — I6340 Cerebral infarction due to embolism of unspecified cerebral artery: Secondary | ICD-10-CM

## 2023-12-26 DIAGNOSIS — F32A Depression, unspecified: Secondary | ICD-10-CM | POA: Diagnosis not present

## 2023-12-26 DIAGNOSIS — I10 Essential (primary) hypertension: Secondary | ICD-10-CM | POA: Diagnosis not present

## 2023-12-26 DIAGNOSIS — I6389 Other cerebral infarction: Secondary | ICD-10-CM | POA: Diagnosis not present

## 2023-12-26 DIAGNOSIS — F17210 Nicotine dependence, cigarettes, uncomplicated: Secondary | ICD-10-CM | POA: Diagnosis not present

## 2023-12-26 DIAGNOSIS — I639 Cerebral infarction, unspecified: Secondary | ICD-10-CM | POA: Diagnosis not present

## 2023-12-26 DIAGNOSIS — F419 Anxiety disorder, unspecified: Secondary | ICD-10-CM | POA: Diagnosis not present

## 2023-12-26 LAB — BASIC METABOLIC PANEL WITH GFR
Anion gap: 8 (ref 5–15)
BUN: 10 mg/dL (ref 6–20)
CO2: 23 mmol/L (ref 22–32)
Calcium: 9 mg/dL (ref 8.9–10.3)
Chloride: 109 mmol/L (ref 98–111)
Creatinine, Ser: 0.88 mg/dL (ref 0.44–1.00)
GFR, Estimated: >60 mL/min (ref >60)
Glucose, Bld: 92 mg/dL (ref 70–99)
Potassium: 3.8 mmol/L (ref 3.5–5.1)
Sodium: 140 mmol/L (ref 135–145)

## 2023-12-26 LAB — HEMOGLOBIN A1C
Hgb A1c MFr Bld: 5.3 % (ref 4.8–5.6)
Mean Plasma Glucose: 105.41 mg/dL

## 2023-12-26 LAB — CBC
HCT: 37.6 % (ref 36.0–46.0)
Hemoglobin: 12.6 g/dL (ref 12.0–15.0)
MCH: 30.7 pg (ref 26.0–34.0)
MCHC: 33.5 g/dL (ref 30.0–36.0)
MCV: 91.5 fL (ref 80.0–100.0)
Platelets: 219 K/uL (ref 150–400)
RBC: 4.11 MIL/uL (ref 3.87–5.11)
RDW: 13.7 % (ref 11.5–15.5)
WBC: 7.1 K/uL (ref 4.0–10.5)
nRBC: 0 % (ref 0.0–0.2)

## 2023-12-26 LAB — LIPID PANEL
Cholesterol: 232 mg/dL — ABNORMAL HIGH (ref 0–200)
HDL: 47 mg/dL (ref >40)
LDL Cholesterol: 167 mg/dL — ABNORMAL HIGH (ref 0–99)
Total CHOL/HDL Ratio: 4.9 ratio
Triglycerides: 90 mg/dL (ref <150)
VLDL: 18 mg/dL (ref 0–40)

## 2023-12-26 LAB — ECHOCARDIOGRAM COMPLETE
Area-P 1/2: 3.12 cm2
Height: 66 in
S' Lateral: 3 cm
Weight: 2240 [oz_av]

## 2023-12-26 LAB — HIV ANTIBODY (ROUTINE TESTING W REFLEX): HIV Screen 4th Generation wRfx: NONREACTIVE

## 2023-12-26 MED ORDER — LOSARTAN POTASSIUM 25 MG PO TABS
25.0000 mg | ORAL_TABLET | Freq: Every morning | ORAL | Status: DC
  Administered 2023-12-27: 25 mg via ORAL
  Filled 2023-12-26: qty 1

## 2023-12-26 MED ORDER — LOSARTAN POTASSIUM 50 MG PO TABS
50.0000 mg | ORAL_TABLET | Freq: Every evening | ORAL | Status: DC
  Administered 2023-12-26: 50 mg via ORAL
  Filled 2023-12-26: qty 1

## 2023-12-26 MED ORDER — LOSARTAN POTASSIUM 25 MG PO TABS: 25.0000 mg | ORAL_TABLET | ORAL | Status: DC

## 2023-12-26 MED ORDER — EZETIMIBE 10 MG PO TABS
10.0000 mg | ORAL_TABLET | Freq: Every day | ORAL | Status: DC
  Administered 2023-12-26 – 2023-12-27 (×2): 10 mg via ORAL
  Filled 2023-12-26 (×2): qty 1

## 2023-12-26 MED ORDER — ATORVASTATIN CALCIUM 80 MG PO TABS
80.0000 mg | ORAL_TABLET | Freq: Every day | ORAL | Status: DC
  Administered 2023-12-26 – 2023-12-27 (×2): 80 mg via ORAL
  Filled 2023-12-26 (×2): qty 1

## 2023-12-26 MED ORDER — FLUTICASONE PROPIONATE 50 MCG/ACT NA SUSP
2.0000 | Freq: Every day | NASAL | Status: DC
  Administered 2023-12-26 – 2023-12-27 (×2): 2 via NASAL
  Filled 2023-12-26: qty 16

## 2023-12-26 MED ORDER — CLONAZEPAM 1 MG PO TABS
1.0000 mg | ORAL_TABLET | Freq: Every day | ORAL | Status: DC | PRN
  Administered 2023-12-26: 1 mg via ORAL
  Filled 2023-12-26: qty 1

## 2023-12-26 NOTE — TOC Initial Note (Signed)
 Transition of Care [facility]) - Initial/Assessment Note    Patient Details  Name: Zoe Gordon MRN: [id number] Date of Birth: 1970/07/16  [facility] CM/SW Contact:    Andrez JULIANNA George, RN Phone Number: 12/26/2023, 11:56 AM  Clinical Narrative:                  Zoe Gordon is a 53 y.o. female with medical history significant for hypertension, depression, and anxiety who presents with right leg weakness.   Pt is from home with her boyfriend. Pt states he can take a few days off to assist her at home after dc.  Both drive.  She manages her own medications and denies any issues.   She is in agreement to attend outpt rehab at [facility]. CM will fax [facility] the orders after pt is seen per OT. Information will be on AVS. [facility] will contact her for the first appointment.  IP Care management following.  Expected Discharge Plan: OP Rehab Barriers to Discharge: Continued Medical Work up   Patient Goals and CMS Choice     Choice offered to / list presented to : Patient      Expected Discharge Plan and Services   Discharge Planning Services: CM Consult   Living arrangements for the past 2 months: Single Family Home                                      Prior Living Arrangements/Services Living arrangements for the past 2 months: Single Family Home Lives with:: Significant Other Patient language and need for interpreter reviewed:: Yes Do you feel safe going back to the place where you live?: Yes        Care giver support system in place?: Yes (comment) Current home services: DME (cane/ walker/ shower seat/ BSC/ elevated toilet)    Activities of Daily Living   ADL Screening (condition at time of admission) Independently performs ADLs?: No Does the patient have a NEW difficulty with bathing/dressing/toileting/self-feeding that is expected to last >3 days?: No Does the patient have a NEW difficulty with getting in/out of bed,  walking, or climbing stairs that is expected to last >3 days?: No Does the patient have a NEW difficulty with communication that is expected to last >3 days?: No Is the patient deaf or have difficulty hearing?: No Does the patient have difficulty seeing, even when wearing glasses/contacts?: No Does the patient have difficulty concentrating, remembering, or making decisions?: No  Permission Sought/Granted                  Emotional Assessment Appearance:: Appears stated age Attitude/Demeanor/Rapport: Engaged Affect (typically observed): Accepting Orientation: : Oriented to Self, Oriented to Place, Oriented to  Time, Oriented to Situation   Psych Involvement: No (comment)  Admission diagnosis:  Weakness [R53.1] Right foot drop [M21.371] Ischemic stroke [facility]) [I63.9] Cerebrovascular accident (CVA), unspecified mechanism (HCC) [I63.9] Patient Active Problem List   Diagnosis Date Noted   Ischemic stroke (HCC) 12/25/2023   Hypertension    Anxiety    Depression    PCP:  Benson Eleanor Rung, NP Pharmacy:   [facility] [phone] - [place], [place] - [address] AT [facility] [address] [facility] [address] & [address] [place] [address] Phone: [phone] Fax: [phone]     Social Drivers of Health (SDOH) Social History: SDOH Screenings   Food Insecurity: No Food  Insecurity (12/26/2023)  Housing: Low Risk  (12/26/2023)  Transportation Needs: No Transportation Needs (12/26/2023)  Utilities: Not At Risk (12/26/2023)  Social Connections: Moderately Isolated (12/26/2023)  Tobacco Use: High Risk (12/25/2023)   SDOH Interventions:     Readmission Risk Interventions     No data to display

## 2023-12-26 NOTE — Plan of Care (Signed)
   Problem: Ischemic Stroke/TIA Tissue Perfusion: Goal: Complications of ischemic stroke/TIA will be minimized Outcome: Progressing

## 2023-12-26 NOTE — H&P (View-Only) (Signed)
 [facility]                                                                               Zoe Gordon, is a 53 y.o. female, DOB - [birth year], FMW:[id number] Admit date - 12/25/2023    Outpatient Primary MD for the patient is Brown-Patram, Eleanor Rung, NP  LOS - 0  days    Brief summary    Zoe Gordon is a 53 y.o. female with medical history significant for hypertension, depression, and anxiety who presents with right leg weakness.  Patient reports 1 week of right leg weakness (though clinic note from 12/12/2023 notes that she was complaining of it then).  She also reports some numbness and tingling in the right leg as well as numbness involving the right hand.   Per patient report, MRI of the lumbar spine and brain were performed last week and she was called with results on the day of admission.    Assessment & Plan    Assessment and Plan:   Progressive right leg weakness with right foot drop Left frontal stroke on MRI done at [facility].  CTA head and neck is negative.  Echocardiogram unremarkable.  TEE to be scheduled.  TCD bubble study done and pending.  Hypercoagulable panel pending.  Plan for aspirin and plavix for 3 weeks followed by aspirin alone.  LDL is 168, will need statin on discharge. Currently on lipitor 80 mg daily and zetia 10 mg daily.  Therapy eval recommending outpatient PT.    Hypertension Permissive hypertension.  Restart losartan from tomorrow.    Depression and anxiety  Resume home meds.         Estimated body mass index is 22.6 kg/m as calculated from the following:   Height as of this encounter: 5' 6 (1.676 m).   Weight as of this encounter: 63.5 kg.  Code Status: full code.  DVT Prophylaxis:  enoxaparin (LOVENOX) injection 40 mg Start: 12/25/23 2100   Level of Care: Level of care: Telemetry Medical Family Communication: family at bedside.   Disposition Plan:     Remains inpatient appropriate:   pending.   Procedures:  Echo TCD   Consultants:   Neurology.  Cardiology for TEE.   Antimicrobials:   Anti-infectives (From admission, onward)    None        Medications  Scheduled Meds:  aspirin EC  81 mg Oral Daily   clopidogrel  75 mg Oral Daily   enoxaparin (LOVENOX) injection  40 mg Subcutaneous Q24H   Continuous Infusions:  sodium chloride 75 mL/hr at 12/26/23 1326   PRN Meds:.acetaminophen **OR** acetaminophen (TYLENOL) oral liquid 160 mg/5 mL **OR** acetaminophen, senna-docusate    Subjective:   Zoe Gordon was seen and examined today.    Objective:   Vitals:   12/26/23 0534 12/26/23 0728 12/26/23 1129 12/26/23 1546  BP: (!) 148/63 (!) 153/69 (!) 160/57 138/67  Pulse: 75 83 67 72  Resp: 18 19 19 18   Temp: 98.4 F (36.9 C) 98.4 F (36.9 C) 98.2 F (36.8 C) 98.9 F (37.2 C)  TempSrc:  Oral Oral Oral  SpO2: 100% 95% 100% 99%  Weight:  Height:        Intake/Output Summary (Last 24 hours) at 12/26/2023 1555 Last data filed at 12/26/2023 1300 Gross per 24 hour  Intake 544.71 ml  Output --  Net 544.71 ml   Filed Weights   12/25/23 1426  Weight: 63.5 kg     Exam General: Alert and oriented x 3, NAD Cardiovascular: S1 S2 auscultated, no murmurs, RRR Respiratory: Clear to auscultation bilaterally, no wheezing, rales or rhonchi Gastrointestinal: Soft, nontender, nondistended, + bowel sounds Ext: no pedal edema bilaterally Neuro: AAOx3,  right sided weakness.  Skin: No rashes Psych: Normal affect and demeanor, alert and oriented x3    Data Reviewed:  I have personally reviewed following labs and imaging studies   CBC Lab Results  Component Value Date   WBC 7.1 12/26/2023   RBC 4.11 12/26/2023   HGB 12.6 12/26/2023   HCT 37.6 12/26/2023   MCV 91.5 12/26/2023   MCH 30.7 12/26/2023   PLT 219 12/26/2023   MCHC 33.5 12/26/2023   RDW 13.7 12/26/2023   LYMPHSABS 1.8 12/25/2023   MONOABS 0.5 12/25/2023   EOSABS 0.1  12/25/2023   BASOSABS 0.0 12/25/2023     Last metabolic panel Lab Results  Component Value Date   NA 140 12/26/2023   K 3.8 12/26/2023   CL 109 12/26/2023   CO2 23 12/26/2023   BUN 10 12/26/2023   CREATININE 0.88 12/26/2023   GLUCOSE 92 12/26/2023   GFRNONAA >60 12/26/2023   CALCIUM 9.0 12/26/2023   PROT 6.9 12/21/2023   ALBUMIN 4.3 12/21/2023   BILITOT 0.5 12/21/2023   ALKPHOS 91 12/21/2023   AST 14 (L) 12/21/2023   ALT 10 12/21/2023   ANIONGAP 8 12/26/2023    CBG (last 3)  No results for input(s): GLUCAP in the last 72 hours.    Coagulation Profile: No results for input(s): INR, PROTIME in the last 168 hours.   Radiology Studies: VAS US  TRANSCRANIAL DOPPLER W BUBBLES Result Date: 12/26/2023  Transcranial Doppler with Bubble Patient Name:  Zoe Gordon  Date of Exam:   12/26/2023 Medical Rec #: [id number]         Accession #:    [id number] Date of Birth: Feb 20, 1971         Patient Gender: F Patient Age:   20 years Exam Location:  [facility] ([facility]) Procedure:      VAS US  TRANSCRANIAL DOPPLER W BUBBLES Referring Phys: EARLE DE LA TORRE --------------------------------------------------------------------------------  Indications: Stroke. Comparison Study: No prior studies. Performing Technologist: Cordella Collet RVT  Examination Guidelines: A complete evaluation includes B-mode imaging, spectral Doppler, color Doppler, and power Doppler as needed of all accessible portions of each vessel. Bilateral testing is considered an integral part of a complete examination. Limited examinations for reoccurring indications may be performed as noted.  Summary:  A vascular evaluation was performed. The right middle cerebral artery was studied. An IV was inserted into the patient's right Cephalic. Verbal informed consent was obtained.  No HITS detected at rest and with valsalva maneuver. Negative for PFO. *See table(s) above for TCD measurements and observations.    Preliminary     ECHOCARDIOGRAM COMPLETE Result Date: 12/26/2023    ECHOCARDIOGRAM REPORT   Patient Name:   Zoe Gordon Date of Exam: 12/26/2023 Medical Rec #:  [id number]        Height:       66.0 in Accession #:    [id number]       Weight:       140.0  lb Date of Birth:  Jan 07, 1971        BSA:          1.719 m Patient Age:    53 years         BP:           153/69 mmHg Patient Gender: F                HR:           78 bpm. Exam Location:  Inpatient Procedure: 2D Echo, 3D Echo, Cardiac Doppler, Color Doppler and Strain Analysis            (Both Spectral and Color Flow Doppler were utilized during            procedure). Indications:    Stroke I63.9  History:        Patient has no prior history of Echocardiogram examinations.                 Stroke; Risk Factors:Hypertension and Current Smoker.  Sonographer:    Koleen Popper RDCS Referring Phys: [id number] TIMOTHY S OPYD  Sonographer Comments: Image acquisition challenging due to respiratory motion. Global longitudinal strain was attempted. IMPRESSIONS  1. Left ventricular ejection fraction, by estimation, is 55 to 60%. Left ventricular ejection fraction by 3D volume is 56 %. The left ventricle has normal function. The left ventricle has no regional wall motion abnormalities. Left ventricular diastolic  parameters were normal. The average left ventricular global longitudinal strain is -18.0 %. The global longitudinal strain is normal.  2. Right ventricular systolic function is normal. The right ventricular size is normal. Tricuspid regurgitation signal is inadequate for assessing PA pressure.  3. The mitral valve is normal in structure. Mild to moderate mitral valve regurgitation. No evidence of mitral stenosis.  4. The aortic valve is normal in structure. Aortic valve regurgitation is not visualized. No aortic stenosis is present.  5. The inferior vena cava is dilated in size with >50% respiratory variability, suggesting right atrial pressure of 8 mmHg. FINDINGS  Left  Ventricle: Left ventricular ejection fraction, by estimation, is 55 to 60%. Left ventricular ejection fraction by 3D volume is 56 %. The left ventricle has normal function. The left ventricle has no regional wall motion abnormalities. The average left ventricular global longitudinal strain is -18.0 %. Strain was performed and the global longitudinal strain is normal. The left ventricular internal cavity size was normal in size. There is no left ventricular hypertrophy. Left ventricular diastolic parameters were normal. Right Ventricle: The right ventricular size is normal. No increase in right ventricular wall thickness. Right ventricular systolic function is normal. Tricuspid regurgitation signal is inadequate for assessing PA pressure. Left Atrium: Left atrial size was normal in size. Right Atrium: Right atrial size was normal in size. Pericardium: There is no evidence of pericardial effusion. Mitral Valve: The mitral valve is normal in structure. Mild to moderate mitral valve regurgitation, with posteriorly-directed jet. No evidence of mitral valve stenosis. Tricuspid Valve: The tricuspid valve is normal in structure. Tricuspid valve regurgitation is not demonstrated. No evidence of tricuspid stenosis. Aortic Valve: The aortic valve is normal in structure. Aortic valve regurgitation is not visualized. No aortic stenosis is present. Pulmonic Valve: The pulmonic valve was normal in structure. Pulmonic valve regurgitation is not visualized. No evidence of pulmonic stenosis. Aorta: The aortic root is normal in size and structure. Venous: The inferior vena cava is dilated in size with greater than 50% respiratory variability, suggesting right  atrial pressure of 8 mmHg. IAS/Shunts: No atrial level shunt detected by color flow Doppler. Additional Comments: 3D was performed not requiring image post processing on an independent workstation and was normal.  LEFT VENTRICLE PLAX 2D LVIDd:         4.50 cm         Diastology  LVIDs:         3.00 cm         LV e' medial:    10.00 cm/s LV PW:         1.00 cm         LV E/e' medial:  11.2 LV IVS:        1.20 cm         LV e' lateral:   15.10 cm/s LVOT diam:     1.70 cm         LV E/e' lateral: 7.4 LV SV:         60 LV SV Index:   35              2D Longitudinal LVOT Area:     2.27 cm        Strain                                2D Strain GLS   -18.0 %                                Avg:                                 3D Volume EF                                LV 3D EF:    Left                                             ventricul                                             ar                                             ejection                                             fraction                                             by 3D  volume is                                             56 %.                                 3D Volume EF:                                3D EF:        56 %                                LV EDV:       132 ml                                LV ESV:       59 ml                                LV SV:        73 ml RIGHT VENTRICLE             IVC RV Basal diam:  3.30 cm     IVC diam: 2.50 cm RV S prime:     15.90 cm/s TAPSE (M-mode): 2.3 cm LEFT ATRIUM             Index        RIGHT ATRIUM           Index LA diam:        2.80 cm 1.63 cm/m   RA Area:     10.90 cm LA Vol (A2C):   48.1 ml 27.99 ml/m  RA Volume:   23.80 ml  13.85 ml/m LA Vol (A4C):   35.0 ml 20.37 ml/m LA Biplane Vol: 41.6 ml 24.21 ml/m  AORTIC VALVE LVOT Vmax:   129.00 cm/s LVOT Vmean:  88.000 cm/s LVOT VTI:    0.265 m  AORTA Ao Root diam: 3.10 cm Ao Asc diam:  3.40 cm MITRAL VALVE MV Area (PHT): 3.12 cm     SHUNTS MV Decel Time: 243 msec     Systemic VTI:  0.26 m MV E velocity: 112.00 cm/s  Systemic Diam: 1.70 cm MV A velocity: 131.00 cm/s MV E/A ratio:  0.85 Mihai Croitoru MD Electronically signed by Jerel Balding MD Signature Date/Time:  12/26/2023/10:17:13 AM    Final    CT ANGIO HEAD NECK W WO CM Result Date: 12/25/2023 EXAM: CTA HEAD AND NECK WITH AND WITHOUT 12/25/2023 10:50:37 PM TECHNIQUE: CTA of the head and neck was performed with and without the administration of 75 mL of iohexol (OMNIPAQUE) 350 MG/ML injection. Multiplanar 2D and/or 3D reformatted images are provided for review. Automated exposure control, iterative reconstruction, and/or weight based adjustment of the mA/kV was utilized to reduce the radiation dose to as low as reasonably achievable. Stenosis of the internal carotid arteries measured using NASCET criteria. COMPARISON: Brain MRI 12/22/2023 CLINICAL HISTORY: CVA workup. Right sided deficits. FINDINGS: CTA NECK: AORTIC ARCH AND ARCH VESSELS: Calcific aortic atherosclerosis. No dissection or arterial  injury. No significant stenosis of the brachiocephalic or subclavian arteries. CERVICAL CAROTID ARTERIES: Minimal atherosclerosis at the carotid bifurcations without hemodynamically significant stenosis by NASCET criteria. No dissection or arterial injury. CERVICAL VERTEBRAL ARTERIES: No dissection, arterial injury, or significant stenosis. LUNGS AND MEDIASTINUM: Unremarkable. SOFT TISSUES: No acute abnormality. BONES: No acute abnormality. CTA HEAD: ANTERIOR CIRCULATION: Mild calcific atherosclerosis of the cavernous segments of the internal carotid arteries. No significant stenosis of the anterior cerebral arteries. No significant stenosis of the middle cerebral arteries. No aneurysm. POSTERIOR CIRCULATION: No significant stenosis of the posterior cerebral arteries. No significant stenosis of the basilar artery. No significant stenosis of the vertebral arteries. No aneurysm. OTHER: No dural venous sinus thrombosis on this non-dedicated study. IMPRESSION: 1. No large vessel occlusion, hemodynamically significant stenosis, or aneurysm in the head or neck. 2. Minimal atherosclerosis at the carotid bifurcations without  hemodynamically significant stenosis. 3. Mild calcific atherosclerosis of the cavernous segments of the internal carotid arteries. Electronically signed by: Franky Stanford MD 12/25/2023 11:08 PM EDT RP Workstation: [id number]   CT Head Wo Contrast Result Date: 12/25/2023 CLINICAL DATA:  Right leg weakness EXAM: CT HEAD WITHOUT CONTRAST TECHNIQUE: Contiguous axial images were obtained from the base of the skull through the vertex without intravenous contrast. RADIATION DOSE REDUCTION: This exam was performed according to the departmental dose-optimization program which includes automated exposure control, adjustment of the mA and/or kV according to patient size and/or use of iterative reconstruction technique. COMPARISON:  MRI 12/22/2023 FINDINGS: Brain: No hemorrhage or intracranial mass. Mild white matter hypodensity and focal hypodensity at the left high frontal cortex, series 3 image 27, in the region of previously noted MRI findings. No significant mass effect. The ventricles are nonenlarged Vascular: No hyperdense vessels.  Carotid vascular calcification Skull: Normal. Negative for fracture or focal lesion. Sinuses/Orbits: No acute finding. Other: None IMPRESSION: Mild white matter hypodensity and focal hypodensity at the left high frontal cortex, in the region of previously noted MRI findings, reference prior report. No significant mass effect. No hemorrhage. Electronically Signed   By: Luke Bun M.D.   On: 12/25/2023 15:38       Elgie Butter M.D. [facility] 12/26/2023, 3:55 PM  Available via Epic secure chat 7am-7pm After 7 pm, please refer to night coverage provider listed on amion.

## 2023-12-26 NOTE — Progress Notes (Signed)
 RN received care for Pt at bedside 3W bed 11. Pt is alert and oriented, anxious, and somewhat restless. Pt admitted and assessment complete.

## 2023-12-26 NOTE — Progress Notes (Signed)
 TCD with bubbles has been completed. Preliminary results can be found in CV Proc through chart review.   12/26/23 2:30 PM Cathlyn Collet RVT

## 2023-12-26 NOTE — Evaluation (Signed)
 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 female who presented to hospital 10/20 with R leg weakness, worsening R foot drop which began one week prior, and intermittent lightheadedness, weakness, and SOB. CT imaging showing mild white matter hypodensity and focal hypodensity at the left high frontal cortex. Significant PMH includes HTN, depression, anxiety, and end stage L hip OA.   Assessment / Plan / Recommendation Clinical Impression  Patient was evaluated via the Cognistat to assess cognitive linguistic functioning. Patient is a hair stylist, who was independent with medications/finances and all other iADLs PTA. Patient reports some cognitive changes since admission, specifically with thought organization. Patient scored WFL on all subtests of the Cognistat with the exception of mild deficits in problem solving. Patient would benefit from OP ST to target high level cognitive skills due to patients prior level of independence and age.     SLP Assessment  SLP Recommendation/Assessment: All further Speech Language  Pathology needs can be addressed in the next venue of care SLP Visit Diagnosis: Cognitive communication deficit (R41.841)     Assistance Recommended at Discharge  Set up Supervision/Assistance  Functional Status Assessment Patient has had a recent decline in their functional status and demonstrates the ability to make significant improvements in function in a reasonable and predictable amount of time.     SLP Evaluation Cognition  Overall Cognitive Status: Impaired/Different from baseline Arousal/Alertness: Awake/alert Orientation Level: Oriented X4 Year: 2025 Month: October Day of Week: Correct Attention: Focused;Sustained Focused Attention: Appears intact Sustained Attention: Appears intact Memory: Appears intact Awareness: Appears intact Problem Solving: Impaired Problem Solving Impairment: Verbal complex Comments: thought organization deficits       Comprehension  Auditory Comprehension Overall Auditory Comprehension: Appears within functional limits for tasks assessed    Expression Expression Primary Mode of Expression: Verbal Verbal Expression Overall Verbal Expression: Appears within functional limits for tasks assessed   Oral / Motor  Oral Motor/Sensory Function Overall Oral Motor/Sensory Function: Within functional limits Motor Speech Overall Motor Speech: Appears within functional limits for tasks assessed            Izamar Linden M.A., CCC-SLP 12/26/2023, 11:31 AM

## 2023-12-26 NOTE — Evaluation (Signed)
 Occupational Therapy Evaluation Patient Details Name: Zoe Gordon MRN: [id number] DOB: Oct 13, 1970 Today's Date: 12/26/2023   History of Present Illness   Pt is a 53 y.o. female who presented to hospital 10/20 with R leg weakness, worsening R foot drop which began one week prior, and intermittent lightheadedness, weakness, and SOB. CT imaging showing mild white matter hypodensity and focal hypodensity at the left high frontal cortex. Significant PMH includes HTN, depression, anxiety, and end stage L hip OA.     Clinical Impressions Pt greeted in supine, agreeable for OT visit. PTA, pt was fully independent with ADLs, IADLs, driving, and working full time as a Interior and spatial designer. She has been using a cane over the last ~1 week due to R foot drop. She presents with RLE>RUE weakness, mild sensory changes in R fingertips, and mild balance deficits. Functionally, she required up to CGA for LB dressing, supervision for standing UB ADLs, and no more than CGA for functional mobility with straight cane. Pt is notably anxious about return to driving - recommend OPOT assessment for readiness to return to driving, given acute RLE motor/sensory deficits.   No further acute OT needs at this time, recommend OPOT upon discharge.     If plan is discharge home, recommend the following:   Assist for transportation     Functional Status Assessment         Equipment Recommendations   None recommended by OT     Recommendations for Other Services         Precautions/Restrictions   Precautions Precautions: Fall Restrictions Weight Bearing Restrictions Per Provider Order: No     Mobility Bed Mobility Overal bed mobility: Independent                  Transfers Overall transfer level: Needs assistance Equipment used: Straight cane Transfers: Sit to/from Stand Sit to Stand: Supervision           General transfer comment: Stood from bed with good hand placement. Ambulated  with intermittent CGA due to mild balance deficits when changing directions.      Balance Overall balance assessment: Mild deficits observed, not formally tested                                         ADL either performed or assessed with clinical judgement   ADL Overall ADL's : Needs assistance/impaired Eating/Feeding: Modified independent   Grooming: Modified independent;Wash/dry hands;Oral care;Standing               Lower Body Dressing: Contact guard assist;Cueing for sequencing;Cueing for compensatory techniques;Sitting/lateral leans Lower Body Dressing Details (indicate cue type and reason): inc effort to thread RLE through underwear pants hole, cues for modified technique - initially completing in long sitting in bed Toilet Transfer: Modified Independent;Ambulation (cane)   Toileting- Clothing Manipulation and Hygiene: Supervision/safety;Sitting/lateral lean               Vision Baseline Vision/History: 1 Wears glasses Ability to See in Adequate Light: 0 Adequate Patient Visual Report: No change from baseline Vision Assessment?: No apparent visual deficits     Perception         Praxis         Pertinent Vitals/Pain Pain Assessment Pain Assessment: No/denies pain Pain Score: 0-No pain     Extremity/Trunk Assessment Upper Extremity Assessment Upper Extremity Assessment: Right hand dominant;RUE deficits/detail RUE Deficits / Details: mild  strength deficits in elbow flexors/extensors & grip strength; pt endorses numbness/tingling in digits 1-3 fingertips RUE Sensation: decreased light touch RUE Coordination: WNL   Lower Extremity Assessment Lower Extremity Assessment: Defer to PT evaluation       Communication Communication Communication: No apparent difficulties   Cognition Arousal: Alert Behavior During Therapy: WFL for tasks assessed/performed, Anxious Cognition: No apparent impairments             OT - Cognition  Comments: pleasant & participatory; anxious at times, benefits from reassurance                 Following commands: Intact       Cueing  General Comments   Cueing Techniques: Verbal cues  overall pleasant & participatory, but noticeably anxious re: current deficits and ability to drive again   Exercises     Shoulder Instructions      Home Living Family/patient expects to be discharged to:: Private residence Living Arrangements: Spouse/significant other Available Help at Discharge: Family;Available PRN/intermittently Type of Home: House Home Access: Ramped entrance     Home Layout: Able to live on main level with bedroom/bathroom;Two level (+ basement) Alternate Level Stairs-Number of Steps: Pt stated several steps; number not given Alternate Level Stairs-Rails: Right;Left;Can reach both Bathroom Shower/Tub: Producer, television/film/video: Standard Bathroom Accessibility: Yes   Home Equipment: Agricultural consultant (2 wheels);Cane - single point;BSC/3in1;Shower seat          Prior Functioning/Environment Prior Level of Function : Independent/Modified Independent             Mobility Comments: Has been using a cane over the last week due to R foot drop. 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    OT Problem List: Decreased strength;Decreased range of motion;Impaired balance (sitting and/or standing)   OT Treatment/Interventions:        OT Goals(Current goals can be found in the care plan section)   Acute Rehab OT Goals Patient Stated Goal: to be able to drive again OT Goal Formulation: With patient   OT Frequency:       Co-evaluation              AM-PAC OT 6 Clicks Daily Activity     Outcome Measure Help from another person eating meals?: None Help from another person taking care of personal grooming?: None Help from another person toileting, which includes using toliet, bedpan, or urinal?:  None Help from another person bathing (including washing, rinsing, drying)?: A Little Help from another person to put on and taking off regular upper body clothing?: None Help from another person to put on and taking off regular lower body clothing?: A Little 6 Click Score: 22   End of Session    Activity Tolerance: Patient tolerated treatment well Patient left: in bed;with nursing/sitter in room;with call bell/phone within reach  OT Visit Diagnosis: Unsteadiness on feet (R26.81)                Time: 9040-8969 OT Time Calculation (min): 31 min Charges:  OT General Charges $OT Visit: 1 Visit OT Evaluation $OT Eval Low Complexity: 1 Low OT Treatments $Therapeutic Activity: 8-22 mins  Rikki CORDOBA MSOT, OTR/L Acute Rehabilitation Services [phone] Secure Chat Preferred  Rikki Milch 12/26/2023, 12:30 PM

## 2023-12-26 NOTE — Discharge Instructions (Signed)
 Dear Zoe Gordon,   Congratulations for your interest in quitting smoking!  Find a program that suits you best: when you want to quit, how you need support, where you live, and how you like to learn.    If you're ready to get started TODAY, consider scheduling a visit through [email or web address].  Appointments are available from 8am to 8pm, Monday to Friday.   Most health insurance plans will cover some level of tobacco cessation visits and medications.    Additional Resources: [employer] are also available to help you quit & provide the support you'll need. Many programs are available in both [place] and Spanish and have a long history of successfully helping people get off and stay off tobacco.    Quit Smoking Apps:  quitSTART at [employer]iousBro[employer].de QuitGuide?at ForgetParking.dk Online education and resources: Smokefree  at [employer].gov Free Telephone Coaching: QuitNow,  Call 1-800-QUIT-NOW ((910) 264-7432) or Text- Ready to (760)208-4517 *Quitline [place] has teamed up with Medicaid to offer a free 14 week program    Vaping- Want to Quit? Free 24/7 support. Call [facility]  [place], [place], [place], [place], [place]  [facility]

## 2023-12-26 NOTE — Progress Notes (Signed)
 [facility]                                                                               Zoe Gordon, is a 53 y.o. female, DOB - [birth year], FMW:[id number] Admit date - 12/25/2023    Outpatient Primary MD for the patient is Brown-Patram, Eleanor Rung, NP  LOS - 0  days    Brief summary    Zoe Gordon is a 53 y.o. female with medical history significant for hypertension, depression, and anxiety who presents with right leg weakness.  Patient reports 1 week of right leg weakness (though clinic note from 12/12/2023 notes that she was complaining of it then).  She also reports some numbness and tingling in the right leg as well as numbness involving the right hand.   Per patient report, MRI of the lumbar spine and brain were performed last week and she was called with results on the day of admission.    Assessment & Plan    Assessment and Plan:   Progressive right leg weakness with right foot drop Left frontal stroke on MRI done at [facility].  CTA head and neck is negative.  Echocardiogram unremarkable.  TEE to be scheduled.  TCD bubble study done and pending.  Hypercoagulable panel pending.  Plan for aspirin and plavix for 3 weeks followed by aspirin alone.  LDL is 168, will need statin on discharge. Currently on lipitor 80 mg daily and zetia 10 mg daily.  Therapy eval recommending outpatient PT.    Hypertension Permissive hypertension.  Restart losartan from tomorrow.    Depression and anxiety  Resume home meds.         Estimated body mass index is 22.6 kg/m as calculated from the following:   Height as of this encounter: 5' 6 (1.676 m).   Weight as of this encounter: 63.5 kg.  Code Status: full code.  DVT Prophylaxis:  enoxaparin (LOVENOX) injection 40 mg Start: 12/25/23 2100   Level of Care: Level of care: Telemetry Medical Family Communication: family at bedside.   Disposition Plan:     Remains inpatient appropriate:   pending.   Procedures:  Echo TCD   Consultants:   Neurology.  Cardiology for TEE.   Antimicrobials:   Anti-infectives (From admission, onward)    None        Medications  Scheduled Meds:  aspirin EC  81 mg Oral Daily   clopidogrel  75 mg Oral Daily   enoxaparin (LOVENOX) injection  40 mg Subcutaneous Q24H   Continuous Infusions:  sodium chloride 75 mL/hr at 12/26/23 1326   PRN Meds:.acetaminophen **OR** acetaminophen (TYLENOL) oral liquid 160 mg/5 mL **OR** acetaminophen, senna-docusate    Subjective:   Zoe Gordon was seen and examined today.    Objective:   Vitals:   12/26/23 0534 12/26/23 0728 12/26/23 1129 12/26/23 1546  BP: (!) 148/63 (!) 153/69 (!) 160/57 138/67  Pulse: 75 83 67 72  Resp: 18 19 19 18   Temp: 98.4 F (36.9 C) 98.4 F (36.9 C) 98.2 F (36.8 C) 98.9 F (37.2 C)  TempSrc:  Oral Oral Oral  SpO2: 100% 95% 100% 99%  Weight:  Height:        Intake/Output Summary (Last 24 hours) at 12/26/2023 1555 Last data filed at 12/26/2023 1300 Gross per 24 hour  Intake 544.71 ml  Output --  Net 544.71 ml   Filed Weights   12/25/23 1426  Weight: 63.5 kg     Exam General: Alert and oriented x 3, NAD Cardiovascular: S1 S2 auscultated, no murmurs, RRR Respiratory: Clear to auscultation bilaterally, no wheezing, rales or rhonchi Gastrointestinal: Soft, nontender, nondistended, + bowel sounds Ext: no pedal edema bilaterally Neuro: AAOx3,  right sided weakness.  Skin: No rashes Psych: Normal affect and demeanor, alert and oriented x3    Data Reviewed:  I have personally reviewed following labs and imaging studies   CBC Lab Results  Component Value Date   WBC 7.1 12/26/2023   RBC 4.11 12/26/2023   HGB 12.6 12/26/2023   HCT 37.6 12/26/2023   MCV 91.5 12/26/2023   MCH 30.7 12/26/2023   PLT 219 12/26/2023   MCHC 33.5 12/26/2023   RDW 13.7 12/26/2023   LYMPHSABS 1.8 12/25/2023   MONOABS 0.5 12/25/2023   EOSABS 0.1  12/25/2023   BASOSABS 0.0 12/25/2023     Last metabolic panel Lab Results  Component Value Date   NA 140 12/26/2023   K 3.8 12/26/2023   CL 109 12/26/2023   CO2 23 12/26/2023   BUN 10 12/26/2023   CREATININE 0.88 12/26/2023   GLUCOSE 92 12/26/2023   GFRNONAA >60 12/26/2023   CALCIUM 9.0 12/26/2023   PROT 6.9 12/21/2023   ALBUMIN 4.3 12/21/2023   BILITOT 0.5 12/21/2023   ALKPHOS 91 12/21/2023   AST 14 (L) 12/21/2023   ALT 10 12/21/2023   ANIONGAP 8 12/26/2023    CBG (last 3)  No results for input(s): GLUCAP in the last 72 hours.    Coagulation Profile: No results for input(s): INR, PROTIME in the last 168 hours.   Radiology Studies: VAS US  TRANSCRANIAL DOPPLER W BUBBLES Result Date: 12/26/2023  Transcranial Doppler with Bubble Patient Name:  Zoe Gordon  Date of Exam:   12/26/2023 Medical Rec #: [id number]         Accession #:    [id number] Date of Birth: Feb 20, 1971         Patient Gender: F Patient Age:   20 years Exam Location:  [facility] ([facility]) Procedure:      VAS US  TRANSCRANIAL DOPPLER W BUBBLES Referring Phys: EARLE DE LA TORRE --------------------------------------------------------------------------------  Indications: Stroke. Comparison Study: No prior studies. Performing Technologist: Cordella Collet RVT  Examination Guidelines: A complete evaluation includes B-mode imaging, spectral Doppler, color Doppler, and power Doppler as needed of all accessible portions of each vessel. Bilateral testing is considered an integral part of a complete examination. Limited examinations for reoccurring indications may be performed as noted.  Summary:  A vascular evaluation was performed. The right middle cerebral artery was studied. An IV was inserted into the patient's right Cephalic. Verbal informed consent was obtained.  No HITS detected at rest and with valsalva maneuver. Negative for PFO. *See table(s) above for TCD measurements and observations.    Preliminary     ECHOCARDIOGRAM COMPLETE Result Date: 12/26/2023    ECHOCARDIOGRAM REPORT   Patient Name:   Zoe Gordon Date of Exam: 12/26/2023 Medical Rec #:  [id number]        Height:       66.0 in Accession #:    [id number]       Weight:       140.0  lb Date of Birth:  Jan 07, 1971        BSA:          1.719 m Patient Age:    53 years         BP:           153/69 mmHg Patient Gender: F                HR:           78 bpm. Exam Location:  Inpatient Procedure: 2D Echo, 3D Echo, Cardiac Doppler, Color Doppler and Strain Analysis            (Both Spectral and Color Flow Doppler were utilized during            procedure). Indications:    Stroke I63.9  History:        Patient has no prior history of Echocardiogram examinations.                 Stroke; Risk Factors:Hypertension and Current Smoker.  Sonographer:    Koleen Popper RDCS Referring Phys: [id number] TIMOTHY S OPYD  Sonographer Comments: Image acquisition challenging due to respiratory motion. Global longitudinal strain was attempted. IMPRESSIONS  1. Left ventricular ejection fraction, by estimation, is 55 to 60%. Left ventricular ejection fraction by 3D volume is 56 %. The left ventricle has normal function. The left ventricle has no regional wall motion abnormalities. Left ventricular diastolic  parameters were normal. The average left ventricular global longitudinal strain is -18.0 %. The global longitudinal strain is normal.  2. Right ventricular systolic function is normal. The right ventricular size is normal. Tricuspid regurgitation signal is inadequate for assessing PA pressure.  3. The mitral valve is normal in structure. Mild to moderate mitral valve regurgitation. No evidence of mitral stenosis.  4. The aortic valve is normal in structure. Aortic valve regurgitation is not visualized. No aortic stenosis is present.  5. The inferior vena cava is dilated in size with >50% respiratory variability, suggesting right atrial pressure of 8 mmHg. FINDINGS  Left  Ventricle: Left ventricular ejection fraction, by estimation, is 55 to 60%. Left ventricular ejection fraction by 3D volume is 56 %. The left ventricle has normal function. The left ventricle has no regional wall motion abnormalities. The average left ventricular global longitudinal strain is -18.0 %. Strain was performed and the global longitudinal strain is normal. The left ventricular internal cavity size was normal in size. There is no left ventricular hypertrophy. Left ventricular diastolic parameters were normal. Right Ventricle: The right ventricular size is normal. No increase in right ventricular wall thickness. Right ventricular systolic function is normal. Tricuspid regurgitation signal is inadequate for assessing PA pressure. Left Atrium: Left atrial size was normal in size. Right Atrium: Right atrial size was normal in size. Pericardium: There is no evidence of pericardial effusion. Mitral Valve: The mitral valve is normal in structure. Mild to moderate mitral valve regurgitation, with posteriorly-directed jet. No evidence of mitral valve stenosis. Tricuspid Valve: The tricuspid valve is normal in structure. Tricuspid valve regurgitation is not demonstrated. No evidence of tricuspid stenosis. Aortic Valve: The aortic valve is normal in structure. Aortic valve regurgitation is not visualized. No aortic stenosis is present. Pulmonic Valve: The pulmonic valve was normal in structure. Pulmonic valve regurgitation is not visualized. No evidence of pulmonic stenosis. Aorta: The aortic root is normal in size and structure. Venous: The inferior vena cava is dilated in size with greater than 50% respiratory variability, suggesting right  atrial pressure of 8 mmHg. IAS/Shunts: No atrial level shunt detected by color flow Doppler. Additional Comments: 3D was performed not requiring image post processing on an independent workstation and was normal.  LEFT VENTRICLE PLAX 2D LVIDd:         4.50 cm         Diastology  LVIDs:         3.00 cm         LV e' medial:    10.00 cm/s LV PW:         1.00 cm         LV E/e' medial:  11.2 LV IVS:        1.20 cm         LV e' lateral:   15.10 cm/s LVOT diam:     1.70 cm         LV E/e' lateral: 7.4 LV SV:         60 LV SV Index:   35              2D Longitudinal LVOT Area:     2.27 cm        Strain                                2D Strain GLS   -18.0 %                                Avg:                                 3D Volume EF                                LV 3D EF:    Left                                             ventricul                                             ar                                             ejection                                             fraction                                             by 3D  volume is                                             56 %.                                 3D Volume EF:                                3D EF:        56 %                                LV EDV:       132 ml                                LV ESV:       59 ml                                LV SV:        73 ml RIGHT VENTRICLE             IVC RV Basal diam:  3.30 cm     IVC diam: 2.50 cm RV S prime:     15.90 cm/s TAPSE (M-mode): 2.3 cm LEFT ATRIUM             Index        RIGHT ATRIUM           Index LA diam:        2.80 cm 1.63 cm/m   RA Area:     10.90 cm LA Vol (A2C):   48.1 ml 27.99 ml/m  RA Volume:   23.80 ml  13.85 ml/m LA Vol (A4C):   35.0 ml 20.37 ml/m LA Biplane Vol: 41.6 ml 24.21 ml/m  AORTIC VALVE LVOT Vmax:   129.00 cm/s LVOT Vmean:  88.000 cm/s LVOT VTI:    0.265 m  AORTA Ao Root diam: 3.10 cm Ao Asc diam:  3.40 cm MITRAL VALVE MV Area (PHT): 3.12 cm     SHUNTS MV Decel Time: 243 msec     Systemic VTI:  0.26 m MV E velocity: 112.00 cm/s  Systemic Diam: 1.70 cm MV A velocity: 131.00 cm/s MV E/A ratio:  0.85 Mihai Croitoru MD Electronically signed by Jerel Balding MD Signature Date/Time:  12/26/2023/10:17:13 AM    Final    CT ANGIO HEAD NECK W WO CM Result Date: 12/25/2023 EXAM: CTA HEAD AND NECK WITH AND WITHOUT 12/25/2023 10:50:37 PM TECHNIQUE: CTA of the head and neck was performed with and without the administration of 75 mL of iohexol (OMNIPAQUE) 350 MG/ML injection. Multiplanar 2D and/or 3D reformatted images are provided for review. Automated exposure control, iterative reconstruction, and/or weight based adjustment of the mA/kV was utilized to reduce the radiation dose to as low as reasonably achievable. Stenosis of the internal carotid arteries measured using NASCET criteria. COMPARISON: Brain MRI 12/22/2023 CLINICAL HISTORY: CVA workup. Right sided deficits. FINDINGS: CTA NECK: AORTIC ARCH AND ARCH VESSELS: Calcific aortic atherosclerosis. No dissection or arterial  injury. No significant stenosis of the brachiocephalic or subclavian arteries. CERVICAL CAROTID ARTERIES: Minimal atherosclerosis at the carotid bifurcations without hemodynamically significant stenosis by NASCET criteria. No dissection or arterial injury. CERVICAL VERTEBRAL ARTERIES: No dissection, arterial injury, or significant stenosis. LUNGS AND MEDIASTINUM: Unremarkable. SOFT TISSUES: No acute abnormality. BONES: No acute abnormality. CTA HEAD: ANTERIOR CIRCULATION: Mild calcific atherosclerosis of the cavernous segments of the internal carotid arteries. No significant stenosis of the anterior cerebral arteries. No significant stenosis of the middle cerebral arteries. No aneurysm. POSTERIOR CIRCULATION: No significant stenosis of the posterior cerebral arteries. No significant stenosis of the basilar artery. No significant stenosis of the vertebral arteries. No aneurysm. OTHER: No dural venous sinus thrombosis on this non-dedicated study. IMPRESSION: 1. No large vessel occlusion, hemodynamically significant stenosis, or aneurysm in the head or neck. 2. Minimal atherosclerosis at the carotid bifurcations without  hemodynamically significant stenosis. 3. Mild calcific atherosclerosis of the cavernous segments of the internal carotid arteries. Electronically signed by: Franky Stanford MD 12/25/2023 11:08 PM EDT RP Workstation: [id number]   CT Head Wo Contrast Result Date: 12/25/2023 CLINICAL DATA:  Right leg weakness EXAM: CT HEAD WITHOUT CONTRAST TECHNIQUE: Contiguous axial images were obtained from the base of the skull through the vertex without intravenous contrast. RADIATION DOSE REDUCTION: This exam was performed according to the departmental dose-optimization program which includes automated exposure control, adjustment of the mA and/or kV according to patient size and/or use of iterative reconstruction technique. COMPARISON:  MRI 12/22/2023 FINDINGS: Brain: No hemorrhage or intracranial mass. Mild white matter hypodensity and focal hypodensity at the left high frontal cortex, series 3 image 27, in the region of previously noted MRI findings. No significant mass effect. The ventricles are nonenlarged Vascular: No hyperdense vessels.  Carotid vascular calcification Skull: Normal. Negative for fracture or focal lesion. Sinuses/Orbits: No acute finding. Other: None IMPRESSION: Mild white matter hypodensity and focal hypodensity at the left high frontal cortex, in the region of previously noted MRI findings, reference prior report. No significant mass effect. No hemorrhage. Electronically Signed   By: Luke Bun M.D.   On: 12/25/2023 15:38       Elgie Butter M.D. [facility] 12/26/2023, 3:55 PM  Available via Epic secure chat 7am-7pm After 7 pm, please refer to night coverage provider listed on amion.

## 2023-12-26 NOTE — Progress Notes (Signed)
   [facility] has been requested to perform a transesophageal echocardiogram on Kimanh Templeman for cryptogenic stroke.     The patient does NOT have any absolute or relative contraindications to a Transesophageal Echocardiogram (TEE).  The patient has: No other conditions that may impact this procedure.    After careful review of history and examination, the risks and benefits of transesophageal echocardiogram have been explained including risks of esophageal damage, perforation (1:10,000 risk), bleeding, pharyngeal hematoma as well as other potential complications associated with conscious sedation including aspiration, arrhythmia, respiratory failure and death. Alternatives to treatment were discussed, questions were answered. Patient is willing to proceed.   Bonney Leontine LOISE Garrick, PA-C  12/26/2023 7:04 PM

## 2023-12-26 NOTE — Progress Notes (Signed)
  Echocardiogram 2D Echocardiogram has been performed.  Koleen KANDICE Popper, RDCS 12/26/2023, 8:23 AM

## 2023-12-26 NOTE — Progress Notes (Addendum)
 STROKE TEAM PROGRESS NOTE    SIGNIFICANT HOSPITAL EVENTS: 10/20 - presented with c/o progressive right leg weakness and right foot drop and numbness   INTERIM HISTORY/SUBJECTIVE: Patient reports she has had right foot drop for one week. She went to her PCP, who thought this may be spinal cord related and had MRI brain and spine done outpatient on 10/17. She became concerned when she noticed increasing weakness in R leg. States she got MRI results via MyChart yesterday, which reported a left-sided frontal cortical and subcortical infarct. Today, patient presents with R leg weakness, R foot drop, paresthesias of RUE and RLE, tingling of L foot. Denies hx of migraines, multiple miscarriages, palpitations. Denies family hx of hypercholesterolemia, stroke, or sudden cardiac death.   OBJECTIVE:  CBC    Component Value Date/Time   WBC 7.1 12/26/2023 0501   RBC 4.11 12/26/2023 0501   HGB 12.6 12/26/2023 0501   HCT 37.6 12/26/2023 0501   PLT 219 12/26/2023 0501   MCV 91.5 12/26/2023 0501   MCH 30.7 12/26/2023 0501   MCHC 33.5 12/26/2023 0501   RDW 13.7 12/26/2023 0501   LYMPHSABS 1.8 12/25/2023 1447   MONOABS 0.5 12/25/2023 1447   EOSABS 0.1 12/25/2023 1447   BASOSABS 0.0 12/25/2023 1447    BMET    Component Value Date/Time   NA 140 12/26/2023 0501   K 3.8 12/26/2023 0501   CL 109 12/26/2023 0501   CO2 23 12/26/2023 0501   GLUCOSE 92 12/26/2023 0501   BUN 10 12/26/2023 0501   CREATININE 0.88 12/26/2023 0501   CALCIUM 9.0 12/26/2023 0501   GFRNONAA >60 12/26/2023 0501    IMAGING past 24 hours ECHOCARDIOGRAM COMPLETE Result Date: 12/26/2023    ECHOCARDIOGRAM REPORT   Patient Name:   SHAMAR KRACKE Date of Exam: 12/26/2023 Medical Rec #:  [id number]        Height:       66.0 in Accession #:    [id number]       Weight:       140.0 lb Date of Birth:  07/12/1970        BSA:          1.719 m Patient Age:    53 years         BP:           153/69 mmHg Patient Gender: F                 HR:           78 bpm. Exam Location:  Inpatient Procedure: 2D Echo, 3D Echo, Cardiac Doppler, Color Doppler and Strain Analysis            (Both Spectral and Color Flow Doppler were utilized during            procedure). Indications:    Stroke I63.9  History:        Patient has no prior history of Echocardiogram examinations.                 Stroke; Risk Factors:Hypertension and Current Smoker.  Sonographer:    Koleen Popper RDCS Referring Phys: [id number] TIMOTHY S OPYD  Sonographer Comments: Image acquisition challenging due to respiratory motion. Global longitudinal strain was attempted. IMPRESSIONS  1. Left ventricular ejection fraction, by estimation, is 55 to 60%. Left ventricular ejection fraction by 3D volume is 56 %. The left ventricle has normal function. The left ventricle has no regional wall motion abnormalities. Left ventricular diastolic  parameters  were normal. The average left ventricular global longitudinal strain is -18.0 %. The global longitudinal strain is normal.  2. Right ventricular systolic function is normal. The right ventricular size is normal. Tricuspid regurgitation signal is inadequate for assessing PA pressure.  3. The mitral valve is normal in structure. Mild to moderate mitral valve regurgitation. No evidence of mitral stenosis.  4. The aortic valve is normal in structure. Aortic valve regurgitation is not visualized. No aortic stenosis is present.  5. The inferior vena cava is dilated in size with >50% respiratory variability, suggesting right atrial pressure of 8 mmHg. FINDINGS  Left Ventricle: Left ventricular ejection fraction, by estimation, is 55 to 60%. Left ventricular ejection fraction by 3D volume is 56 %. The left ventricle has normal function. The left ventricle has no regional wall motion abnormalities. The average left ventricular global longitudinal strain is -18.0 %. Strain was performed and the global longitudinal strain is normal. The left ventricular internal cavity  size was normal in size. There is no left ventricular hypertrophy. Left ventricular diastolic parameters were normal. Right Ventricle: The right ventricular size is normal. No increase in right ventricular wall thickness. Right ventricular systolic function is normal. Tricuspid regurgitation signal is inadequate for assessing PA pressure. Left [facility]: Left atrial size was normal in size. Right [facility]: Right atrial size was normal in size. Pericardium: There is no evidence of pericardial effusion. Mitral Valve: The mitral valve is normal in structure. Mild to moderate mitral valve regurgitation, with posteriorly-directed jet. No evidence of mitral valve stenosis. Tricuspid Valve: The tricuspid valve is normal in structure. Tricuspid valve regurgitation is not demonstrated. No evidence of tricuspid stenosis. Aortic Valve: The aortic valve is normal in structure. Aortic valve regurgitation is not visualized. No aortic stenosis is present. Pulmonic Valve: The pulmonic valve was normal in structure. Pulmonic valve regurgitation is not visualized. No evidence of pulmonic stenosis. Aorta: The aortic root is normal in size and structure. Venous: The inferior vena cava is dilated in size with greater than 50% respiratory variability, suggesting right atrial pressure of 8 mmHg. IAS/Shunts: No atrial level shunt detected by color flow Doppler. Additional Comments: 3D was performed not requiring image post processing on an independent workstation and was normal.  LEFT VENTRICLE PLAX 2D LVIDd:         4.50 cm         Diastology LVIDs:         3.00 cm         LV e' medial:    10.00 cm/s LV PW:         1.00 cm         LV E/e' medial:  11.2 LV IVS:        1.20 cm         LV e' lateral:   15.10 cm/s LVOT diam:     1.70 cm         LV E/e' lateral: 7.4 LV SV:         60 LV SV Index:   35              2D Longitudinal LVOT Area:     2.27 cm        Strain                                2D Strain GLS   -18.0 %  Avg:                                 3D Volume EF                                LV 3D EF:    Left                                             ventricul                                             ar                                             ejection                                             fraction                                             by 3D                                             volume is                                             56 %.                                 3D Volume EF:                                3D EF:        56 %                                LV EDV:       132 ml                                LV ESV:       59 ml                                LV SV:        73 ml RIGHT VENTRICLE  IVC RV Basal diam:  3.30 cm     IVC diam: 2.50 cm RV S prime:     15.90 cm/s TAPSE (M-mode): 2.3 cm LEFT ATRIUM             Index        RIGHT ATRIUM           Index LA diam:        2.80 cm 1.63 cm/m   RA Area:     10.90 cm LA Vol (A2C):   48.1 ml 27.99 ml/m  RA Volume:   23.80 ml  13.85 ml/m LA Vol (A4C):   35.0 ml 20.37 ml/m LA Biplane Vol: 41.6 ml 24.21 ml/m  AORTIC VALVE LVOT Vmax:   129.00 cm/s LVOT Vmean:  88.000 cm/s LVOT VTI:    0.265 m  AORTA Ao Root diam: 3.10 cm Ao Asc diam:  3.40 cm MITRAL VALVE MV Area (PHT): 3.12 cm     SHUNTS MV Decel Time: 243 msec     Systemic VTI:  0.26 m MV E velocity: 112.00 cm/s  Systemic Diam: 1.70 cm MV A velocity: 131.00 cm/s MV E/A ratio:  0.85 Mihai Croitoru MD Electronically signed by Jerel Balding MD Signature Date/Time: 12/26/2023/10:17:13 AM    Final    CT ANGIO HEAD NECK W WO CM Result Date: 12/25/2023 EXAM: CTA HEAD AND NECK WITH AND WITHOUT 12/25/2023 10:50:37 PM TECHNIQUE: CTA of the head and neck was performed with and without the administration of 75 mL of iohexol (OMNIPAQUE) 350 MG/ML injection. Multiplanar 2D and/or 3D reformatted images are provided for review. Automated exposure control, iterative reconstruction, and/or  weight based adjustment of the mA/kV was utilized to reduce the radiation dose to as low as reasonably achievable. Stenosis of the internal carotid arteries measured using NASCET criteria. COMPARISON: Brain MRI 12/22/2023 CLINICAL HISTORY: CVA workup. Right sided deficits. FINDINGS: CTA NECK: AORTIC ARCH AND ARCH VESSELS: Calcific aortic atherosclerosis. No dissection or arterial injury. No significant stenosis of the brachiocephalic or subclavian arteries. CERVICAL CAROTID ARTERIES: Minimal atherosclerosis at the carotid bifurcations without hemodynamically significant stenosis by NASCET criteria. No dissection or arterial injury. CERVICAL VERTEBRAL ARTERIES: No dissection, arterial injury, or significant stenosis. LUNGS AND MEDIASTINUM: Unremarkable. SOFT TISSUES: No acute abnormality. BONES: No acute abnormality. CTA HEAD: ANTERIOR CIRCULATION: Mild calcific atherosclerosis of the cavernous segments of the internal carotid arteries. No significant stenosis of the anterior cerebral arteries. No significant stenosis of the middle cerebral arteries. No aneurysm. POSTERIOR CIRCULATION: No significant stenosis of the posterior cerebral arteries. No significant stenosis of the basilar artery. No significant stenosis of the vertebral arteries. No aneurysm. OTHER: No dural venous sinus thrombosis on this non-dedicated study. IMPRESSION: 1. No large vessel occlusion, hemodynamically significant stenosis, or aneurysm in the head or neck. 2. Minimal atherosclerosis at the carotid bifurcations without hemodynamically significant stenosis. 3. Mild calcific atherosclerosis of the cavernous segments of the internal carotid arteries. Electronically signed by: Franky Stanford MD 12/25/2023 11:08 PM EDT RP Workstation: [id number]   CT Head Wo Contrast Result Date: 12/25/2023 CLINICAL DATA:  Right leg weakness EXAM: CT HEAD WITHOUT CONTRAST TECHNIQUE: Contiguous axial images were obtained from the base of the skull through the  vertex without intravenous contrast. RADIATION DOSE REDUCTION: This exam was performed according to the departmental dose-optimization program which includes automated exposure control, adjustment of the mA and/or kV according to patient size and/or use of iterative reconstruction technique. COMPARISON:  MRI 12/22/2023 FINDINGS: Brain: No hemorrhage or intracranial mass. Mild white  matter hypodensity and focal hypodensity at the left high frontal cortex, series 3 image 27, in the region of previously noted MRI findings. No significant mass effect. The ventricles are nonenlarged Vascular: No hyperdense vessels.  Carotid vascular calcification Skull: Normal. Negative for fracture or focal lesion. Sinuses/Orbits: No acute finding. Other: None IMPRESSION: Mild white matter hypodensity and focal hypodensity at the left high frontal cortex, in the region of previously noted MRI findings, reference prior report. No significant mass effect. No hemorrhage. Electronically Signed   By: Luke Bun M.D.   On: 12/25/2023 15:38    Vitals:   12/26/23 0400 12/26/23 0500 12/26/23 0534 12/26/23 0728  BP: (!) 110/55 (!) 125/56 (!) 148/63 (!) 153/69  Pulse: 71 67 75 83  Resp: (!) 22 16 18 19   Temp:   98.4 F (36.9 C) 98.4 F (36.9 C)  TempSrc:    Oral  SpO2: 99% 100% 100% 95%  Weight:      Height:         PHYSICAL EXAM: General:  Alert, well-nourished, well-developed patient in no acute distress Psych:  Mood and affect appropriate for situation CV: Regular rate and rhythm on monitor Respiratory: Regular, unlabored respirations on room air   NEURO:  Mental Status: AA&Ox3, patient is able to give clear and coherent history Speech/Language: speech is without dysarthria or aphasia.  Naming, repetition, fluency, and comprehension intact.  Cranial Nerves:  II: PERRL. Visual fields full.  III, IV, VI: EOMI. Eyelids elevate symmetrically.  V: Sensation is intact to light touch and symmetrical to face.  VII:  Face is symmetrical resting and smiling VIII: hearing intact to voice. IX, X: Palate elevates symmetrically. Phonation is normal.  XI: Shoulder shrug 5/5. XII: tongue is midline without fasciculations. Motor: Strength - LUE 5/5, LLE 5/5, RUE 5/5, RLE 3/5. Unable to dorsiflex R foot.  Tone: is normal and bulk is normal Sensation- Diminished sensation in RUE. L foot paresthesias.  Extinction absent to light touch to DSS.   Coordination: FTN intact bilaterally, HKS: no ataxia in BLE. No drift.  Gait- deferred  NIH Stroke Scale: 2   ASSESSMENT/PLAN:  Ms. Myrah Strawderman is a 53 y.o. female with history of hypertension, hypercholesterolemia, tobacco use, depression, anxiety, arthritis admitted for right leg weakness and right foot drop and numbness. NIH on Admission 2.  Today, patient presents with R leg weakness, R foot drop, paresthesias of RUE and RLE, tingling of L foot. Unable to access outpatient MRI from 10/17 through the chart. Clinical findings are suggestive of bilateral strokes. If that is the case, suspect underlying cardioembolic etiology. Will complete work-up as listed in the plan below. Patient with significantly elevated LDL, may need Zetia or PCSK9 inhibitor as an outpatient for better LDL control.  Acute Ischemic Infarct: Left frontal infarct Etiology:  likely embolic, cryptogenic source CT head: Mild white matter hypodensity and focal hypodensity at the left high frontal cortex, in the region of previously noted MRI findings, reference prior report. No significant mass effect. No hemorrhage.  CTA head & neck: No large vessel occlusion, hemodynamically significant stenosis, or aneurysm in the head or neck. Minimal atherosclerosis at the carotid bifurcations without hemodynamically significant stenosis.  Mild calcific atherosclerosis of the cavernous segments of the internal carotid arteries. MRI - performed outpatient on 10/17. Imaging/report not available in our EMR. 2D Echo  EF 55-60%, normal left atrial size TCD w/ bubble study pending LDL 167 HgbA1c 5.3 Labs: ANA, lipoprotein A, cardiolipin antibodies pending VTE prophylaxis - Lovenox  No antithrombotic prior to admission, now on aspirin 81 mg daily and clopidogrel 75 mg daily for 3 weeks and then apsirin alone. Therapy recommendations:  Pending Disposition:  Pending  Hx of Stroke/TIA No prior history reported  Hypertension Home meds: Losartan 25 mg AM + 50 mg PM Stable BP goal of normotension <130/80  Hyperlipidemia Home meds:  None LDL 167, goal < 70 Add atorvastatin 80 mg and Zetia 10 mg daily Continue statin at discharge  Tobacco Abuse Patient smokes 0.5 packs per day       Ready to quit? Yes Nicotine replacement therapy provided  Other Stroke Risk Factors  Hospital day # 0  Ashley Gravely, MD, PGY-1 I have personally obtained history,examined this patient, reviewed notes, independently viewed imaging studies, participated in medical decision making and plan of care.ROS completed by me personally and pertinent positives fully documented  I have made any additions or clarifications directly to the above note. Agree with note above.  Patient presented with 1 week history of progressive right leg weakness starting as a foot drop initially worked up for radiculopathy with spine MRI which was negative with outpatient MRI done at [facility] confirmed a left frontal infarct as per patient's MyChart report however I am unable to find the actual report in Care Everywhere.  Stroke etiology likely cryptogenic.  Recommend further evaluation by checking TEE, TCD bubble study for PFO, ANA panel, hypercoagulable panel labs and urine drug screen.  Recommend aspirin and Plavix for 3 weeks followed by aspirin alone and aggressive risk factor modification.  Cessation of significantly elevated lipids would recommend full dose statin along with Zetia and she may will need PCSK9 inhibitor injections as an outpatient.   Check lipoprotein a level as well.  Patient counseled to quit smoking and she is agreeable.  Long discussion with patient and boyfriend at the bedside and answered questions.  Discussed with Dr. Cherlyn   I personally spent a total of 50 minutes in the care of the patient today including getting/reviewing separately obtained history, performing a medically appropriate exam/evaluation, counseling and educating, placing orders, referring and communicating with other health care professionals, documenting clinical information in the EHR, independently interpreting results, and coordinating care.        Eather Popp, MD Medical Director [facility] Pager: [phone] 12/26/2023 3:31 PM  To contact Stroke Continuity provider, please refer to [email or web address]. After hours, contact General Neurology

## 2023-12-26 NOTE — Evaluation (Signed)
 Physical Therapy Evaluation Patient Details Name: Zoe Gordon MRN: [id number] DOB: Nov 14, 1970 Today's Date: 12/26/2023  History of Present Illness  Pt is a 53 y.o. female who presented to hospital 10/20 with R leg weakness, worsening R foot drop which began one week prior, and intermittent lightheadedness, weakness, and SOB. CT imaging showing mild white matter hypodensity and focal hypodensity at the left high frontal cortex. Significant PMH includes HTN, depression, anxiety, and end stage L hip OA.  Clinical Impression  Pt received reclining in bed today and was agreeable to PT session. Cognitively, pt appeared intact and able to follow commands, but she was intermittently tearful and expressed anxiety regarding her R foot drop. Discussed in length with pt possible outpatient locations and treatments to improve her R foot drop. Also discussed possible timeline for improvement. Pt supervision level for supine to sit and sit to stand transfers, and CGA for ambulating 168ft using SPC. B LE coordination and sensation appear intact, and R LE MMT deficits noted namely in R ankle (0/5 R DF and PF). With ambulation, pt able to hold R ankle to neutral during swing, and occasional R foot scuffing noted with increased distance. Recommend outpatient PT to address mobility and functional deficits. Acute PT to follow.       If plan is discharge home, recommend the following: A little help with walking and/or transfers;A little help with bathing/dressing/bathroom;Assistance with cooking/housework;Assist for transportation;Help with stairs or ramp for entrance   Can travel by private vehicle        Equipment Recommendations None recommended by PT  Recommendations for Other Services       Functional Status Assessment Patient has had a recent decline in their functional status and demonstrates the ability to make significant improvements in function in a reasonable and predictable amount of time.      Precautions / Restrictions Precautions Precautions: Fall Restrictions Weight Bearing Restrictions Per Provider Order: No      Mobility  Bed Mobility Overal bed mobility: Independent             General bed mobility comments: Independent with supine to sit.    Transfers Overall transfer level: Needs assistance Equipment used: Straight cane Transfers: Sit to/from Stand Sit to Stand: Supervision, Contact guard assist           General transfer comment: Pt slow to rise and nervous about gait due to R foot drop    Ambulation/Gait Ambulation/Gait assistance: Contact guard assist Gait Distance (Feet): 150 Feet Assistive device: Straight cane Gait Pattern/deviations: Step-through pattern, Decreased stance time - right, Decreased dorsiflexion - right, Decreased weight shift to right, Decreased step length - right   Gait velocity interpretation: <1.8 ft/sec, indicate of risk for recurrent falls   General Gait Details: Pt demonstrated R ankle DF to neutral during R swing phase and able to clear foot without excessive hip or knee compensation. With increased distance, pt demonstrated occasional R foot drag during swing. No LOB or tripping noted.  Stairs            Wheelchair Mobility     Tilt Bed    Modified Rankin (Stroke Patients Only) Modified Rankin (Stroke Patients Only) Pre-Morbid Rankin Score: No symptoms Modified Rankin: Moderately severe disability     Balance Overall balance assessment: Needs assistance Sitting-balance support: No upper extremity supported, Feet supported Sitting balance-Leahy Scale: Normal     Standing balance support: Reliant on assistive device for balance Standing balance-Leahy Scale: Fair Standing balance comment: Pt able to  stand feet together no AD with CGA     Tandem Stance - Right Leg: 10 Tandem Stance - Left Leg: 10 (1st attempt: 5sec; 2nd attempt: 10sec) Rhomberg - Eyes Opened: 10 Rhomberg - Eyes Closed: 10 High  level balance activites: Head turns High Level Balance Comments: Able to perform head turns R/L and up/down during ambulation with CGA, but demonstrated slowed gait speed during these.             Pertinent Vitals/Pain Pain Assessment Pain Assessment: 0-10 Pain Score: 0-No pain    Home Living Family/patient expects to be discharged to:: Private residence Living Arrangements: Spouse/significant other Available Help at Discharge: Family;Available PRN/intermittently Type of Home: House Home Access: Ramped entrance     Alternate Level Stairs-Number of Steps: Pt stated several steps; number not given Home Layout: Able to live on main level with bedroom/bathroom;Two level (House is main level + basement) Home Equipment: Rolling Walker (2 wheels);Cane - single point;BSC/3in1;Shower seat      Prior Function Prior Level of Function : Independent/Modified Independent             Mobility Comments: Has been using cane intermittently for the last week due to R foot frop. She denies any recent falls. 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       Extremity/Trunk Assessment   Upper Extremity Assessment Upper Extremity Assessment: Defer to OT evaluation    Lower Extremity Assessment Lower Extremity Assessment: RLE deficits/detail;LLE deficits/detail RLE Deficits / Details: R hip flexion 4+/5; knee extension 5/5; ankle DF and PF 0/5 RLE Sensation: WNL (Intact to light touch) RLE Coordination: WNL LLE Deficits / Details: Gross L LE MMT 5/5 LLE Sensation: WNL LLE Coordination: WNL    Cervical / Trunk Assessment Cervical / Trunk Assessment: Normal  Communication   Communication Communication: No apparent difficulties    Cognition Arousal: Alert Behavior During Therapy: WFL for tasks assessed/performed, Anxious   PT - Cognitive impairments: No apparent impairments                       PT - Cognition Comments: Pt intermittently tearful regarding  deficits and stated she was very stressed about her R foot drop. Following commands: Intact       Cueing Cueing Techniques: Verbal cues     General Comments General comments (skin integrity, edema, etc.): Pt expressed anxiety and stress regarding current deficits.    Exercises     Assessment/Plan    PT Assessment Patient needs continued PT services  PT Problem List Decreased strength;Decreased range of motion;Decreased balance       PT Treatment Interventions DME instruction;Gait training;Functional mobility training;Stair training;Therapeutic activities;Therapeutic exercise;Neuromuscular re-education;Balance training;Patient/family education;Manual techniques;Modalities    PT Goals (Current goals can be found in the Care Plan section)  Acute Rehab PT Goals Patient Stated Goal: for her R foot drop to go back to normal PT Goal Formulation: With patient Time For Goal Achievement: 01/09/24 Potential to Achieve Goals: Good    Frequency Min 2X/week     Co-evaluation               AM-PAC PT 6 Clicks Mobility  Outcome Measure Help needed turning from your back to your side while in a flat bed without using bedrails?: None Help needed moving from lying on your back to sitting on the side of a flat bed without using bedrails?: None Help needed moving to and from a bed to a chair (including a wheelchair)?: A  Little Help needed standing up from a chair using your arms (e.g., wheelchair or bedside chair)?: A Little Help needed to walk in hospital room?: A Little Help needed climbing 3-5 steps with a railing? : A Little 6 Click Score: 20    End of Session Equipment Utilized During Treatment: Gait belt Activity Tolerance: Patient tolerated treatment well Patient left: in chair;with call bell/phone within reach Nurse Communication: Mobility status PT Visit Diagnosis: Other abnormalities of gait and mobility (R26.89);Muscle weakness (generalized) (M62.81);Difficulty in  walking, not elsewhere classified (R26.2);Hemiplegia and hemiparesis Hemiplegia - Right/Left: Right Hemiplegia - dominant/non-dominant: Dominant Hemiplegia - caused by: Cerebral infarction    Time: 0821-0859 PT Time Calculation (min) (ACUTE ONLY): 38 min   Charges:   PT Evaluation $PT Eval Low Complexity: 1 Low PT Treatments $Therapeutic Activity: 8-22 mins PT General Charges $$ ACUTE PT VISIT: 1 Visit         [place] Debie Ashline, SPT   [place] Brynleigh Sequeira 12/26/2023, 10:44 AM

## 2023-12-27 ENCOUNTER — Other Ambulatory Visit (HOSPITAL_COMMUNITY): Payer: Self-pay

## 2023-12-27 ENCOUNTER — Encounter (HOSPITAL_COMMUNITY): Admission: EM | Disposition: A | Discharge status: Home / Self Care | Payer: Self-pay | Source: Home / Self Care

## 2023-12-27 ENCOUNTER — Observation Stay (HOSPITAL_COMMUNITY)

## 2023-12-27 ENCOUNTER — Observation Stay (HOSPITAL_COMMUNITY): Admitting: Anesthesiology

## 2023-12-27 DIAGNOSIS — F17210 Nicotine dependence, cigarettes, uncomplicated: Secondary | ICD-10-CM | POA: Diagnosis not present

## 2023-12-27 DIAGNOSIS — I639 Cerebral infarction, unspecified: Secondary | ICD-10-CM

## 2023-12-27 DIAGNOSIS — E785 Hyperlipidemia, unspecified: Secondary | ICD-10-CM | POA: Diagnosis not present

## 2023-12-27 DIAGNOSIS — I6340 Cerebral infarction due to embolism of unspecified cerebral artery: Secondary | ICD-10-CM | POA: Diagnosis not present

## 2023-12-27 HISTORY — PX: TRANSESOPHAGEAL ECHOCARDIOGRAM (CATH LAB): EP1270

## 2023-12-27 LAB — LIPOPROTEIN A (LPA): Lipoprotein (a): 72.1 nmol/L — ABNORMAL HIGH (ref <75.0)

## 2023-12-27 LAB — BASIC METABOLIC PANEL WITH GFR
Anion gap: 8 (ref 5–15)
BUN: 6 mg/dL (ref 6–20)
CO2: 24 mmol/L (ref 22–32)
Calcium: 9.3 mg/dL (ref 8.9–10.3)
Chloride: 110 mmol/L (ref 98–111)
Creatinine, Ser: 0.89 mg/dL (ref 0.44–1.00)
GFR, Estimated: >60 mL/min (ref >60)
Glucose, Bld: 89 mg/dL (ref 70–99)
Potassium: 4 mmol/L (ref 3.5–5.1)
Sodium: 142 mmol/L (ref 135–145)

## 2023-12-27 LAB — ANA W/REFLEX IF POSITIVE: Anti Nuclear Antibody (ANA): NEGATIVE

## 2023-12-27 LAB — CBC
HCT: 37.7 % (ref 36.0–46.0)
Hemoglobin: 12.2 g/dL (ref 12.0–15.0)
MCH: 29.8 pg (ref 26.0–34.0)
MCHC: 32.4 g/dL (ref 30.0–36.0)
MCV: 92.2 fL (ref 80.0–100.0)
Platelets: 212 K/uL (ref 150–400)
RBC: 4.09 MIL/uL (ref 3.87–5.11)
RDW: 13.4 % (ref 11.5–15.5)
WBC: 7.2 K/uL (ref 4.0–10.5)
nRBC: 0 % (ref 0.0–0.2)

## 2023-12-27 LAB — ECHO TEE

## 2023-12-27 SURGERY — TRANSESOPHAGEAL ECHOCARDIOGRAM (TEE) (CATHLAB)
Anesthesia: Monitor Anesthesia Care

## 2023-12-27 MED ORDER — CLOPIDOGREL BISULFATE 75 MG PO TABS
75.0000 mg | ORAL_TABLET | Freq: Every day | ORAL | 0 refills | Status: AC
  Filled 2023-12-27: qty 18, 18d supply, fill #0

## 2023-12-27 MED ORDER — EZETIMIBE 10 MG PO TABS
10.0000 mg | ORAL_TABLET | Freq: Every day | ORAL | 0 refills | Status: AC
  Filled 2023-12-27: qty 90, 90d supply, fill #0

## 2023-12-27 MED ORDER — LIDOCAINE 2% (20 MG/ML) 5 ML SYRINGE
INTRAMUSCULAR | Status: DC | PRN
  Administered 2023-12-27: 40 mg via INTRAVENOUS

## 2023-12-27 MED ORDER — ATORVASTATIN CALCIUM 80 MG PO TABS
80.0000 mg | ORAL_TABLET | Freq: Every day | ORAL | 0 refills | Status: DC
  Filled 2023-12-27 (×2): qty 90, 90d supply, fill #0

## 2023-12-27 MED ORDER — CLOPIDOGREL BISULFATE 75 MG PO TABS: 75.0000 mg | ORAL_TABLET | Freq: Every day | ORAL | 0 refills | Status: DC

## 2023-12-27 MED ORDER — EZETIMIBE 10 MG PO TABS: 10.0000 mg | ORAL_TABLET | Freq: Every day | ORAL | 0 refills | Status: DC

## 2023-12-27 MED ORDER — SODIUM CHLORIDE 0.9 % IV SOLN: INTRAVENOUS | Status: DC

## 2023-12-27 MED ORDER — ASPIRIN 81 MG PO TBEC: 81.0000 mg | DELAYED_RELEASE_TABLET | Freq: Every day | ORAL | 0 refills | Status: DC

## 2023-12-27 MED ORDER — ASPIRIN 81 MG PO TBEC
81.0000 mg | DELAYED_RELEASE_TABLET | Freq: Every day | ORAL | 0 refills | Status: AC
  Filled 2023-12-27: qty 90, 90d supply, fill #0

## 2023-12-27 MED ORDER — PROPOFOL 10 MG/ML IV BOLUS
INTRAVENOUS | Status: DC | PRN
  Administered 2023-12-27: 20 mg via INTRAVENOUS
  Administered 2023-12-27: 65 ug/kg/min via INTRAVENOUS

## 2023-12-27 MED ORDER — ATORVASTATIN CALCIUM 80 MG PO TABS: 80.0000 mg | ORAL_TABLET | Freq: Every day | ORAL | 0 refills | Status: DC

## 2023-12-27 MED ORDER — BUTAMBEN-TETRACAINE-BENZOCAINE 2-2-14 % EX AERO
INHALATION_SPRAY | CUTANEOUS | Status: AC
  Filled 2023-12-27: qty 20

## 2023-12-27 NOTE — Progress Notes (Addendum)
 Physical Therapy Treatment Patient Details Name: Zoe Gordon MRN: [id number] DOB: 04-Oct-1970 Today's Date: 12/27/2023   History of Present Illness Pt is a 53 y.o. female who presented to hospital 10/20 with R leg weakness, worsening R foot drop which began one week prior, and intermittent lightheadedness, weakness, and SOB. CT imaging showing mild white matter hypodensity and focal hypodensity at the left high frontal cortex. Significant PMH includes HTN, depression, anxiety, and end stage L hip OA.    PT Comments  Pt received in bed with head elevated and agreeable to today's session. Today, pt demonstrated continued anxiety regarding deficits and felt she was doing worse than yesterday. R ankle DF/PF continues to be 0/5, but pt able to flex R toes actively today. Able to ambulate with [facility] for 66' x2 as well as navigate ascending and descending 3 stairs with verbal cues for correct AD usage. During second bout of ambulation, pt required standing rest break as she c/o dizziness, which cleared quickly. Gave handheld assist for second bout of ambulation, more for pt anxiety than the need for actual physical assistance. Recommend outpatient PT upon discharge. Acute PT to follow while admitted.   If plan is discharge home, recommend the following: A little help with walking and/or transfers;A little help with bathing/dressing/bathroom;Assistance with cooking/housework;Assist for transportation;Help with stairs or ramp for entrance   Can travel by private vehicle        Equipment Recommendations  None recommended by PT    Recommendations for Other Services       Precautions / Restrictions Precautions Precautions: Fall Recall of Precautions/Restrictions: Intact Restrictions Weight Bearing Restrictions Per Provider Order: No     Mobility  Bed Mobility Overal bed mobility: Independent             General bed mobility comments: Independent with supine to sit.     Transfers Overall transfer level: Needs assistance Equipment used: Straight cane Transfers: Sit to/from Stand Sit to Stand: Supervision           General transfer comment: Able to perform sit<>stand with superivision level. No LOB noted.    Ambulation/Gait Ambulation/Gait assistance: Contact guard assist Gait Distance (Feet): 72 Feet (x2) Assistive device: Straight cane Gait Pattern/deviations: Step-through pattern, Decreased stance time - right, Decreased dorsiflexion - right, Decreased weight shift to right, Decreased step length - right   Gait velocity interpretation: <1.8 ft/sec, indicate of risk for recurrent falls   General Gait Details: Noted further decreased stance time on the R today. Pt  described feeling slightly dizzy during second bout of ambulation, which cleared quickly. Also noted slight LOB x2 which she recovered independently. utilized handheld assist during second bout of ambulation, likely d/t fear of dragging R toe than actual need for physical assistance. Pt able to navigate and weave through 5 cones in the hallway. Pt also stated she did not feel like she is doing as well this session as compared to her last PT session.   Stairs Stairs: Yes Stairs assistance: Contact guard assist Stair Management: One rail Right, With cane Number of Stairs: 3 (Ascended and descended 3 stairs.) General stair comments: Pt required verbal cueing for correct navigation of stairs using SPC during ascension and descension. No LOB noted.   Wheelchair Mobility     Tilt Bed    Modified Rankin (Stroke Patients Only)       Balance Overall balance assessment: Mild deficits observed, not formally tested Sitting-balance support: No upper extremity supported, Feet supported Sitting balance-Leahy Scale: Normal  Standing balance support: Reliant on assistive device for balance Standing balance-Leahy Scale: Fair                              Production designer, theatre/television/film: No apparent difficulties  Cognition Arousal: Alert Behavior During Therapy: WFL for tasks assessed/performed, Anxious   PT - Cognitive impairments: No apparent impairments                       PT - Cognition Comments: Pt relayed concerns regarding timeline for recovery of her R foot drop intermittently. Following commands: Intact      Cueing Cueing Techniques: Verbal cues  Exercises      General Comments General comments (skin integrity, edema, etc.): R DF/PF seated EOB still 0/5. But, today pt able to flex R toes in seated EOB.      Pertinent Vitals/Pain Pain Assessment Pain Assessment: Faces Faces Pain Scale: Hurts a little bit Pain Location: Described pain in the ball of her R foot, which she said may be because she has a corn/bunion there. Also described L hip pain that increased with further ambulation. Pain Descriptors / Indicators: Discomfort Pain Intervention(s): Monitored during session    Home Living                          Prior Function            PT Goals (current goals can now be found in the care plan section) Acute Rehab PT Goals Patient Stated Goal: for her R foot drop to go back to normal PT Goal Formulation: With patient Time For Goal Achievement: 01/09/24 Potential to Achieve Goals: Good Progress towards PT goals: Progressing toward goals    Frequency    Min 2X/week      PT Plan      Co-evaluation              AM-PAC PT 6 Clicks Mobility   Outcome Measure  Help needed turning from your back to your side while in a flat bed without using bedrails?: None Help needed moving from lying on your back to sitting on the side of a flat bed without using bedrails?: None Help needed moving to and from a bed to a chair (including a wheelchair)?: A Little Help needed standing up from a chair using your arms (e.g., wheelchair or bedside chair)?: A Little Help needed to walk in hospital room?:  A Little Help needed climbing 3-5 steps with a railing? : A Little 6 Click Score: 20    End of Session Equipment Utilized During Treatment: Gait belt Activity Tolerance: Patient tolerated treatment well Patient left: in bed;with call bell/phone within reach   PT Visit Diagnosis: Other abnormalities of gait and mobility (R26.89);Muscle weakness (generalized) (M62.81);Difficulty in walking, not elsewhere classified (R26.2);Hemiplegia and hemiparesis Hemiplegia - Right/Left: Right Hemiplegia - dominant/non-dominant: Dominant Hemiplegia - caused by: Cerebral infarction     Time: 1152-1215 PT Time Calculation (min) (ACUTE ONLY): 23 min  Charges:    $Gait Training: 8-22 mins $Therapeutic Activity: 8-22 mins PT General Charges $$ ACUTE PT VISIT: 1 Visit                     [place] Chia Mowers, SPT    [place] Noe Pittsley 12/27/2023, 3:07 PM

## 2023-12-27 NOTE — Anesthesia Preprocedure Evaluation (Addendum)
 Anesthesia Evaluation  Patient identified by MRN, date of birth, ID band Patient awake    Reviewed: Allergy & Precautions, NPO status , Patient's Chart, lab work & pertinent test results  Airway Mallampati: II  TM Distance: >3 FB Neck ROM: Full    Dental no notable dental hx.    Pulmonary Current Smoker and Patient abstained from smoking.   Pulmonary exam normal        Cardiovascular hypertension, Pt. on medications Normal cardiovascular exam     Neuro/Psych  PSYCHIATRIC DISORDERS Anxiety Depression    CVA, Residual Symptoms    GI/Hepatic   Endo/Other    Renal/GU      Musculoskeletal   Abdominal   Peds  Hematology   Anesthesia Other Findings cryptogenic stroke  Reproductive/Obstetrics                              Anesthesia Physical Anesthesia Plan  ASA: 3  Anesthesia Plan: MAC   Post-op Pain Management:    Induction:   PONV Risk Score and Plan: 1 and Treatment may vary due to age or medical condition  Airway Management Planned: Nasal Cannula  Additional Equipment:   Intra-op Plan:   Post-operative Plan:   Informed Consent: I have reviewed the patients History and Physical, chart, labs and discussed the procedure including the risks, benefits and alternatives for the proposed anesthesia with the patient or authorized representative who has indicated his/her understanding and acceptance.     Dental advisory given  Plan Discussed with: CRNA  Anesthesia Plan Comments:          Anesthesia Quick Evaluation

## 2023-12-27 NOTE — Progress Notes (Signed)
 Orthopedic Tech Progress Note Patient Details:  Zoe Gordon [birth year] [id number]  Called in order to [facility] for an AFO BRACE   Patient ID: Zoe Gordon, female   DOB: 1970/03/30, 53 y.o.   MRN: [id number]  Delanna LITTIE Pac 12/27/2023, 2:21 PM

## 2023-12-27 NOTE — Plan of Care (Signed)

## 2023-12-27 NOTE — Discharge Summary (Signed)
 Physician Discharge Summary   Patient: Zoe Gordon MRN: [id number] DOB: 1971-01-03  Admit date:     12/25/2023  Discharge date: 12/27/23  Discharge Physician: Elgin Lam, MD   PCP: Benson Eleanor Rung, NP   Recommendations at discharge:  PCP visit for hospital follow-up Neurology follow-up  Discharge Diagnoses: Principal Problem:   Ischemic stroke [facility]) Active Problems:   Hypertension   Anxiety   Depression  Resolved Problems:   * No resolved hospital problems. *  Hospital Course: [name] is a 53 y.o. female with a history of hypertension, depression and anxiety.  Patient presented secondary to right leg weakness and prior to arrival MRI concerning for stroke. Neurology consulted for workup. Etiology presumed to be embolic/cryptogenic. Recommendations for patient to discharge on aspirin indefinitely and Plavix for 3 weeks.  Assessment and Plan:  Acute ischemic infarct Patient presented with symptoms including right leg weakness in addition to a prior to admission MRI concerning for stoke. Initial CT head (10/21) significant for significant for mild white matter hypodensity and focal hypodensity at the left high frontal cortex. MRI brain not obtained this admission. CTA head and neck significant for no large vessel occlusion or hemodynamically significant stenosis. LDL of 167. Hemoglobin A1C of 5.3%. Transthoracic Echocardiogram and Transesophageal Echocardiogram  significant for no intracardiac source for stroke and no atrial level shunt. Neurology recommendations for Plavix x3 weeks and Aspirin indefinitely. PT/OT recommendations for outpatient therapy. Recommendation for Neurology follow-up in 4 weeks.  Primary hypertension Continue losartan.  Hyperlipidemia Patient started on Lipitor and Zetia.  Tobacco abuse Cessation discussed by neurology this visit.  Depression Anxiety Continue home Klonopin   Consultants: Neurology Procedures performed:  Transthoracic Echocardiogram, Transesophageal Echocardiogram TCD with bubble Disposition: Home Diet recommendation: Cardiac diet   DISCHARGE MEDICATION: Allergies as of 12/27/2023       Reactions   Sulfamethoxazole Itching   Codeine Itching   Shellfish Allergy Swelling   Eyes swell shut   Strawberry (diagnostic) Hives, Itching        Medication List     TAKE these medications    aspirin EC 81 MG tablet Take 1 tablet (81 mg total) by mouth daily. Swallow whole.   atorvastatin 80 MG tablet Commonly known as: LIPITOR Take 1 tablet (80 mg total) by mouth daily.   clonazePAM 1 MG tablet Commonly known as: KLONOPIN Take 1 mg by mouth daily as needed for anxiety.   clopidogrel 75 MG tablet Commonly known as: PLAVIX Take 1 tablet (75 mg total) by mouth daily for 18 days.   ezetimibe 10 MG tablet Commonly known as: ZETIA Take 1 tablet (10 mg total) by mouth daily.   fluticasone 50 MCG/ACT nasal spray Commonly known as: FLONASE Place 2 sprays into both nostrils daily.   losartan 50 MG tablet Commonly known as: COZAAR Take 25-50 mg by mouth See admin instructions. Take 25 mg by mouth in the morning and 50 mg in the evening   meloxicam 7.5 MG tablet Commonly known as: MOBIC Take 7.5 mg by mouth daily as needed for pain.        Follow-up Information     [facility]. Schedule an appointment as soon as possible for a visit.   Contact information: [address] [place], [place] [address]  [phone]               Discharge Exam: BP (!) 135/54 (BP Location: Left Arm)   Pulse 71   Temp 98.8 F (37.1 C) (Oral)  Resp 16   Ht 5' 6 (1.676 m)   Wt 63.5 kg   LMP  (LMP Unknown)   SpO2 98%   BMI 22.60 kg/m   General exam: Appears calm and comfortable. Respiratory system: Clear to auscultation. Respiratory effort normal. Cardiovascular system: S1 & S2 heard, RRR. No murmur. Gastrointestinal system: Abdomen is nondistended, soft and  nontender. Normal bowel sounds heard. Central nervous system: Alert and oriented. Left upper/lower extremity strength is 5/5. RUE strength is 4/5. RLE strength is 4/5 with right plantarflexion and dorsiflexion at about 3/5 strength Musculoskeletal: No edema. No calf tenderness Psychiatry: Judgement and insight appear normal. Mood & affect appropriate.   Condition at discharge: stable  The results of significant diagnostics from this hospitalization (including imaging, microbiology, ancillary and laboratory) are listed below for reference.   Imaging Studies: VAS US  TRANSCRANIAL DOPPLER W BUBBLES Result Date: 12/27/2023  Transcranial Doppler with Bubble Patient Name:  Zoe Gordon  Date of Exam:   12/26/2023 Medical Rec #: [id number]         Accession #:    [id number] Date of Birth: 04-Sep-1970         Patient Gender: F Patient Age:   29 years Exam Location:  [facility] Procedure:      VAS US  TRANSCRANIAL DOPPLER W BUBBLES Referring Phys: EARLE DE LA TORRE --------------------------------------------------------------------------------  Indications: Stroke. Comparison Study: No prior studies. Performing Technologist: Cordella Collet RVT  Examination Guidelines: A complete evaluation includes B-mode imaging, spectral Doppler, color Doppler, and power Doppler as needed of all accessible portions of each vessel. Bilateral testing is considered an integral part of a complete examination. Limited examinations for reoccurring indications may be performed as noted.  Summary:  A vascular evaluation was performed. The right middle cerebral artery was studied. An IV was inserted into the patient's right Cephalic. Verbal informed consent was obtained.  No HITS detected at rest and with valsalva maneuver. Negative for PFO. Negative TCD Bubble study *See table(s) above for TCD measurements and observations.  Diagnosing physician: Eather Popp MD Electronically signed by Eather Popp MD on 12/27/2023 at  11:25:45 AM.    Final    ECHO TEE Result Date: 12/27/2023    TRANSESOPHOGEAL ECHO REPORT   Patient Name:   [name] Date of Exam: 12/27/2023 Medical Rec #:  [id number]        Height:       66.0 in Accession #:    [id number]       Weight:       140.0 lb Date of Birth:  06-11-1970        BSA:          1.719 m Patient Age:    53 years         BP:           149/65 mmHg Patient Gender: F                HR:           87 bpm. Exam Location:  Inpatient Procedure: Transesophageal Echo, Cardiac Doppler and Color Doppler (Both            Spectral and Color Flow Doppler were utilized during procedure). Indications:     Stroke i63.9  History:         Patient has prior history of Echocardiogram examinations, most                  recent 12/26/2023. Risk Factors:Hypertension.  Sonographer:  [facility] Senior RDCS Referring Phys:  [id number] LEONTINE SAILOR LOCKWOOD Diagnosing Phys: Jerel Balding MD PROCEDURE: After discussion of the risks and benefits of a TEE, an informed consent was obtained from the patient. The transesophogeal probe was passed without difficulty through the esophogus of the patient. Sedation performed by different physician. The patient was monitored while under deep sedation. The patient's vital signs; including heart rate, blood pressure, and oxygen saturation; remained stable throughout the procedure. The patient developed no complications during the procedure.  IMPRESSIONS  1. Left ventricular ejection fraction, by estimation, is 60 to 65%. The left ventricle has normal function. The left ventricle has no regional wall motion abnormalities.  2. Right ventricular systolic function is normal. The right ventricular size is normal.  3. No left atrial/left atrial appendage thrombus was detected.  4. The mitral valve is normal in structure. Trivial mitral valve regurgitation. No evidence of mitral stenosis.  5. The aortic valve is tricuspid. Aortic valve regurgitation is not visualized. No aortic stenosis is  present.  6. Plaque thickness up to 4 mm in the aortic arch. There is Moderate (Grade III) plaque involving the aortic arch and descending aorta.  7. The inferior vena cava is normal in size with greater than 50% respiratory variability, suggesting right atrial pressure of 3 mmHg.  8. Agitated saline contrast bubble study was negative, with no evidence of any interatrial shunt. Conclusion(s)/Recommendation(s): Normal biventricular function without evidence of hemodynamically significant valvular heart disease. No LA/LAA thrombus identified. Negative bubble study for interatrial shunt. No intracardiac source of embolism detected  on this on this transesophageal echocardiogram. Although no ulcreated or mobile plaque is seen in the aortic arch, the burden of plaque is substantially higher than expected for age. FINDINGS  Left Ventricle: Left ventricular ejection fraction, by estimation, is 60 to 65%. The left ventricle has normal function. The left ventricle has no regional wall motion abnormalities. The left ventricular internal cavity size was normal in size. There is  no left ventricular hypertrophy. Right Ventricle: The right ventricular size is normal. No increase in right ventricular wall thickness. Right ventricular systolic function is normal. Left Atrium: Left atrial size was normal in size. No left atrial/left atrial appendage thrombus was detected. Right Atrium: Right atrial size was normal in size. Pericardium: There is no evidence of pericardial effusion. Mitral Valve: The mitral valve is normal in structure. Trivial mitral valve regurgitation. No evidence of mitral valve stenosis. Tricuspid Valve: The tricuspid valve is normal in structure. Tricuspid valve regurgitation is trivial. No evidence of tricuspid stenosis. Aortic Valve: The aortic valve is tricuspid. Aortic valve regurgitation is not visualized. No aortic stenosis is present. Pulmonic Valve: The pulmonic valve was grossly normal. Pulmonic valve  regurgitation is not visualized. No evidence of pulmonic stenosis. Aorta: Plaque thickness up to 4 mm in the aortic arch. The aortic root and ascending aorta are structurally normal, with no evidence of dilitation and the aortic root, ascending aorta, aortic arch and descending aorta are all structurally normal, with no  evidence of dilitation or obstruction. There is moderate (Grade III) plaque involving the aortic arch and descending aorta. Venous: The inferior vena cava is normal in size with greater than 50% respiratory variability, suggesting right atrial pressure of 3 mmHg. IAS/Shunts: No atrial level shunt detected by color flow Doppler. Agitated saline contrast was given intravenously to evaluate for intracardiac shunting. Agitated saline contrast bubble study was negative, with no evidence of any interatrial shunt. Additional Comments: Spectral Doppler performed. Jerel Balding MD  Electronically signed by Jerel Balding MD Signature Date/Time: 12/27/2023/10:53:57 AM    Final    EP STUDY Result Date: 12/27/2023 See surgical note for result.  ECHOCARDIOGRAM COMPLETE Result Date: 12/26/2023    ECHOCARDIOGRAM REPORT   Patient Name:   DYLLAN KATS Date of Exam: 12/26/2023 Medical Rec #:  [id number]        Height:       66.0 in Accession #:    [id number]       Weight:       140.0 lb Date of Birth:  07/06/1970        BSA:          1.719 m Patient Age:    53 years         BP:           153/69 mmHg Patient Gender: F                HR:           78 bpm. Exam Location:  Inpatient Procedure: 2D Echo, 3D Echo, Cardiac Doppler, Color Doppler and Strain Analysis            (Both Spectral and Color Flow Doppler were utilized during            procedure). Indications:    Stroke I63.9  History:        Patient has no prior history of Echocardiogram examinations.                 Stroke; Risk Factors:Hypertension and Current Smoker.  Sonographer:    Koleen Popper RDCS Referring Phys: [id number] TIMOTHY S OPYD   Sonographer Comments: Image acquisition challenging due to respiratory motion. Global longitudinal strain was attempted. IMPRESSIONS  1. Left ventricular ejection fraction, by estimation, is 55 to 60%. Left ventricular ejection fraction by 3D volume is 56 %. The left ventricle has normal function. The left ventricle has no regional wall motion abnormalities. Left ventricular diastolic  parameters were normal. The average left ventricular global longitudinal strain is -18.0 %. The global longitudinal strain is normal.  2. Right ventricular systolic function is normal. The right ventricular size is normal. Tricuspid regurgitation signal is inadequate for assessing PA pressure.  3. The mitral valve is normal in structure. Mild to moderate mitral valve regurgitation. No evidence of mitral stenosis.  4. The aortic valve is normal in structure. Aortic valve regurgitation is not visualized. No aortic stenosis is present.  5. The inferior vena cava is dilated in size with >50% respiratory variability, suggesting right atrial pressure of 8 mmHg. FINDINGS  Left Ventricle: Left ventricular ejection fraction, by estimation, is 55 to 60%. Left ventricular ejection fraction by 3D volume is 56 %. The left ventricle has normal function. The left ventricle has no regional wall motion abnormalities. The average left ventricular global longitudinal strain is -18.0 %. Strain was performed and the global longitudinal strain is normal. The left ventricular internal cavity size was normal in size. There is no left ventricular hypertrophy. Left ventricular diastolic parameters were normal. Right Ventricle: The right ventricular size is normal. No increase in right ventricular wall thickness. Right ventricular systolic function is normal. Tricuspid regurgitation signal is inadequate for assessing PA pressure. Left Atrium: Left atrial size was normal in size. Right Atrium: Right atrial size was normal in size. Pericardium: There is no  evidence of pericardial effusion. Mitral Valve: The mitral valve is normal in structure. Mild to moderate mitral valve regurgitation, with posteriorly-directed jet. No  evidence of mitral valve stenosis. Tricuspid Valve: The tricuspid valve is normal in structure. Tricuspid valve regurgitation is not demonstrated. No evidence of tricuspid stenosis. Aortic Valve: The aortic valve is normal in structure. Aortic valve regurgitation is not visualized. No aortic stenosis is present. Pulmonic Valve: The pulmonic valve was normal in structure. Pulmonic valve regurgitation is not visualized. No evidence of pulmonic stenosis. Aorta: The aortic root is normal in size and structure. Venous: The inferior vena cava is dilated in size with greater than 50% respiratory variability, suggesting right atrial pressure of 8 mmHg. IAS/Shunts: No atrial level shunt detected by color flow Doppler. Additional Comments: 3D was performed not requiring image post processing on an independent workstation and was normal.  LEFT VENTRICLE PLAX 2D LVIDd:         4.50 cm         Diastology LVIDs:         3.00 cm         LV e' medial:    10.00 cm/s LV PW:         1.00 cm         LV E/e' medial:  11.2 LV IVS:        1.20 cm         LV e' lateral:   15.10 cm/s LVOT diam:     1.70 cm         LV E/e' lateral: 7.4 LV SV:         60 LV SV Index:   35              2D Longitudinal LVOT Area:     2.27 cm        Strain                                2D Strain GLS   -18.0 %                                Avg:                                 3D Volume EF                                LV 3D EF:    Left                                             ventricul                                             ar                                             ejection  fraction                                             by 3D                                             volume is                                             56 %.                                  3D Volume EF:                                3D EF:        56 %                                LV EDV:       132 ml                                LV ESV:       59 ml                                LV SV:        73 ml RIGHT VENTRICLE             IVC RV Basal diam:  3.30 cm     IVC diam: 2.50 cm RV S prime:     15.90 cm/s TAPSE (M-mode): 2.3 cm LEFT ATRIUM             Index        RIGHT ATRIUM           Index LA diam:        2.80 cm 1.63 cm/m   RA Area:     10.90 cm LA Vol (A2C):   48.1 ml 27.99 ml/m  RA Volume:   23.80 ml  13.85 ml/m LA Vol (A4C):   35.0 ml 20.37 ml/m LA Biplane Vol: 41.6 ml 24.21 ml/m  AORTIC VALVE LVOT Vmax:   129.00 cm/s LVOT Vmean:  88.000 cm/s LVOT VTI:    0.265 m  AORTA Ao Root diam: 3.10 cm Ao Asc diam:  3.40 cm MITRAL VALVE MV Area (PHT): 3.12 cm     SHUNTS MV Decel Time: 243 msec     Systemic VTI:  0.26 m MV E velocity: 112.00 cm/s  Systemic Diam: 1.70 cm MV A velocity: 131.00 cm/s MV E/A ratio:  0.85 Mihai Croitoru MD Electronically signed by Jerel Balding MD Signature Date/Time: 12/26/2023/10:17:13 AM    Final    CT ANGIO HEAD NECK W WO CM Result Date: 12/25/2023 EXAM: CTA HEAD AND NECK WITH AND WITHOUT 12/25/2023 10:50:37 PM TECHNIQUE: CTA of  the head and neck was performed with and without the administration of 75 mL of iohexol (OMNIPAQUE) 350 MG/ML injection. Multiplanar 2D and/or 3D reformatted images are provided for review. Automated exposure control, iterative reconstruction, and/or weight based adjustment of the mA/kV was utilized to reduce the radiation dose to as low as reasonably achievable. Stenosis of the internal carotid arteries measured using NASCET criteria. COMPARISON: Brain MRI 12/22/2023 CLINICAL HISTORY: CVA workup. Right sided deficits. FINDINGS: CTA NECK: AORTIC ARCH AND ARCH VESSELS: Calcific aortic atherosclerosis. No dissection or arterial injury. No significant stenosis of the brachiocephalic or subclavian arteries.  CERVICAL CAROTID ARTERIES: Minimal atherosclerosis at the carotid bifurcations without hemodynamically significant stenosis by NASCET criteria. No dissection or arterial injury. CERVICAL VERTEBRAL ARTERIES: No dissection, arterial injury, or significant stenosis. LUNGS AND MEDIASTINUM: Unremarkable. SOFT TISSUES: No acute abnormality. BONES: No acute abnormality. CTA HEAD: ANTERIOR CIRCULATION: Mild calcific atherosclerosis of the cavernous segments of the internal carotid arteries. No significant stenosis of the anterior cerebral arteries. No significant stenosis of the middle cerebral arteries. No aneurysm. POSTERIOR CIRCULATION: No significant stenosis of the posterior cerebral arteries. No significant stenosis of the basilar artery. No significant stenosis of the vertebral arteries. No aneurysm. OTHER: No dural venous sinus thrombosis on this non-dedicated study. IMPRESSION: 1. No large vessel occlusion, hemodynamically significant stenosis, or aneurysm in the head or neck. 2. Minimal atherosclerosis at the carotid bifurcations without hemodynamically significant stenosis. 3. Mild calcific atherosclerosis of the cavernous segments of the internal carotid arteries. Electronically signed by: Franky Stanford MD 12/25/2023 11:08 PM EDT RP Workstation: [id number]   CT Head Wo Contrast Result Date: 12/25/2023 CLINICAL DATA:  Right leg weakness EXAM: CT HEAD WITHOUT CONTRAST TECHNIQUE: Contiguous axial images were obtained from the base of the skull through the vertex without intravenous contrast. RADIATION DOSE REDUCTION: This exam was performed according to the departmental dose-optimization program which includes automated exposure control, adjustment of the mA and/or kV according to patient size and/or use of iterative reconstruction technique. COMPARISON:  MRI 12/22/2023 FINDINGS: Brain: No hemorrhage or intracranial mass. Mild white matter hypodensity and focal hypodensity at the left high frontal cortex, series  3 image 27, in the region of previously noted MRI findings. No significant mass effect. The ventricles are nonenlarged Vascular: No hyperdense vessels.  Carotid vascular calcification Skull: Normal. Negative for fracture or focal lesion. Sinuses/Orbits: No acute finding. Other: None IMPRESSION: Mild white matter hypodensity and focal hypodensity at the left high frontal cortex, in the region of previously noted MRI findings, reference prior report. No significant mass effect. No hemorrhage. Electronically Signed   By: Luke Bun M.D.   On: 12/25/2023 15:38    Microbiology: Results for orders placed or performed during the hospital encounter of 12/21/23  Surgical pcr screen     Status: None   Collection Time: 12/21/23  9:07 AM   Specimen: Nasal Mucosa; Nasal Swab  Result Value Ref Range Status   MRSA, PCR NEGATIVE NEGATIVE Final   Staphylococcus aureus NEGATIVE NEGATIVE Final    Comment: (NOTE) The Xpert SA Assay (FDA approved for NASAL specimens in patients 25 years of age and older), is one component of a comprehensive surveillance program. It is not intended to diagnose infection nor to guide or monitor treatment. Performed at [facility], [address] [place], [place] [address]     Labs: CBC: Recent Labs  Lab 12/21/23 0907 12/25/23 1447 12/26/23 0501 12/27/23 0500  WBC 10.5 8.8 7.1 7.2  NEUTROABS  --  6.4  --   --  HGB 13.8 13.8 12.6 12.2  HCT 44.2 41.9 37.6 37.7  MCV 93.8 91.5 91.5 92.2  PLT 300 271 219 212   Basic Metabolic Panel: Recent Labs  Lab 12/21/23 0907 12/25/23 1447 12/26/23 0501 12/27/23 0500  NA 143 140 140 142  K 4.2 3.8 3.8 4.0  CL 107 103 109 110  CO2 25 26 23 24   GLUCOSE 103* 122* 92 89  BUN 11 10 10 6   CREATININE 0.97 0.93 0.88 0.89  CALCIUM 10.4* 9.7 9.0 9.3   Liver Function Tests: Recent Labs  Lab 12/21/23 0907  AST 14*  ALT 10  ALKPHOS 91  BILITOT 0.5  PROT 6.9  ALBUMIN 4.3    Discharge time spent: 35  minutes.  Signed: Elgin Lam, MD [facility] 12/27/2023

## 2023-12-27 NOTE — Interval H&P Note (Signed)
 History and Physical Interval Note:  12/27/2023 9:12 AM  Zoe Gordon  has presented today for surgery, with the diagnosis of cryptogenic stroke..  The various methods of treatment have been discussed with the patient and family. After consideration of risks, benefits and other options for treatment, the patient has consented to  Procedure(s): TRANSESOPHAGEAL ECHOCARDIOGRAM (N/A) as a surgical intervention.  The patient's history has been reviewed, patient examined, no change in status, stable for surgery.  I have reviewed the patient's chart and labs.  Questions were answered to the patient's satisfaction.     Rimas Gilham

## 2023-12-27 NOTE — Progress Notes (Addendum)
 STROKE TEAM PROGRESS NOTE    SIGNIFICANT HOSPITAL EVENTS: 10/20 - presented with c/o one-week history of progressive right leg weakness and right foot drop and numbness   INTERIM HISTORY/SUBJECTIVE: Patient seen at bedside. Reports she had TEE done this morning which was normal. Updated patient on TEE results. Patient wants to go home.  Vital signs stable.  Neurological exam unchanged.  OBJECTIVE:  CBC    Component Value Date/Time   WBC 7.2 12/27/2023 0500   RBC 4.09 12/27/2023 0500   HGB 12.2 12/27/2023 0500   HCT 37.7 12/27/2023 0500   PLT 212 12/27/2023 0500   MCV 92.2 12/27/2023 0500   MCH 29.8 12/27/2023 0500   MCHC 32.4 12/27/2023 0500   RDW 13.4 12/27/2023 0500   LYMPHSABS 1.8 12/25/2023 1447   MONOABS 0.5 12/25/2023 1447   EOSABS 0.1 12/25/2023 1447   BASOSABS 0.0 12/25/2023 1447    BMET    Component Value Date/Time   NA 142 12/27/2023 0500   K 4.0 12/27/2023 0500   CL 110 12/27/2023 0500   CO2 24 12/27/2023 0500   GLUCOSE 89 12/27/2023 0500   BUN 6 12/27/2023 0500   CREATININE 0.89 12/27/2023 0500   CALCIUM 9.3 12/27/2023 0500   GFRNONAA >60 12/27/2023 0500    IMAGING past 24 hours VAS US  TRANSCRANIAL DOPPLER W BUBBLES Result Date: 12/27/2023  Transcranial Doppler with Bubble Patient Name:  Zoe Gordon  Date of Exam:   12/26/2023 Medical Rec #: [id number]         Accession #:    [id number] Date of Birth: 01-03-1971         Patient Gender: F Patient Age:   21 years Exam Location:  [facility] Procedure:      VAS US  TRANSCRANIAL DOPPLER W BUBBLES Referring Phys: EARLE DE LA TORRE --------------------------------------------------------------------------------  Indications: Stroke. Comparison Study: No prior studies. Performing Technologist: Cordella Collet RVT  Examination Guidelines: A complete evaluation includes B-mode imaging, spectral Doppler, color Doppler, and power Doppler as needed of all accessible portions of each vessel. Bilateral  testing is considered an integral part of a complete examination. Limited examinations for reoccurring indications may be performed as noted.  Summary:  A vascular evaluation was performed. The right middle cerebral artery was studied. An IV was inserted into the patient's right Cephalic. Verbal informed consent was obtained.  No HITS detected at rest and with valsalva maneuver. Negative for PFO. Negative TCD Bubble study *See table(s) above for TCD measurements and observations.  Diagnosing physician: Eather Popp MD Electronically signed by Eather Popp MD on 12/27/2023 at 11:25:45 AM.    Final    ECHO TEE Result Date: 12/27/2023    TRANSESOPHOGEAL ECHO REPORT   Patient Name:   Zoe Gordon Date of Exam: 12/27/2023 Medical Rec #:  [id number]        Height:       66.0 in Accession #:    [id number]       Weight:       140.0 lb Date of Birth:  19-Dec-1970        BSA:          1.719 m Patient Age:    53 years         BP:           149/65 mmHg Patient Gender: F                HR:           87 bpm. Exam  Location:  Inpatient Procedure: Transesophageal Echo, Cardiac Doppler and Color Doppler (Both            Spectral and Color Flow Doppler were utilized during procedure). Indications:     Stroke i63.9  History:         Patient has prior history of Echocardiogram examinations, most                  recent 12/26/2023. Risk Factors:Hypertension.  Sonographer:     Damien Senior RDCS Referring Phys:  [id number] LEONTINE SAILOR LOCKWOOD Diagnosing Phys: Jerel Balding MD PROCEDURE: After discussion of the risks and benefits of a TEE, an informed consent was obtained from the patient. The transesophogeal probe was passed without difficulty through the esophogus of the patient. Sedation performed by different physician. The patient was monitored while under deep sedation. The patient's vital signs; including heart rate, blood pressure, and oxygen saturation; remained stable throughout the procedure. The patient developed no  complications during the procedure.  IMPRESSIONS  1. Left ventricular ejection fraction, by estimation, is 60 to 65%. The left ventricle has normal function. The left ventricle has no regional wall motion abnormalities.  2. Right ventricular systolic function is normal. The right ventricular size is normal.  3. No left atrial/left atrial appendage thrombus was detected.  4. The mitral valve is normal in structure. Trivial mitral valve regurgitation. No evidence of mitral stenosis.  5. The aortic valve is tricuspid. Aortic valve regurgitation is not visualized. No aortic stenosis is present.  6. Plaque thickness up to 4 mm in the aortic arch. There is Moderate (Grade III) plaque involving the aortic arch and descending aorta.  7. The inferior vena cava is normal in size with greater than 50% respiratory variability, suggesting right atrial pressure of 3 mmHg.  8. Agitated saline contrast bubble study was negative, with no evidence of any interatrial shunt. Conclusion(s)/Recommendation(s): Normal biventricular function without evidence of hemodynamically significant valvular heart disease. No LA/LAA thrombus identified. Negative bubble study for interatrial shunt. No intracardiac source of embolism detected  on this on this transesophageal echocardiogram. Although no ulcreated or mobile plaque is seen in the aortic arch, the burden of plaque is substantially higher than expected for age. FINDINGS  Left Ventricle: Left ventricular ejection fraction, by estimation, is 60 to 65%. The left ventricle has normal function. The left ventricle has no regional wall motion abnormalities. The left ventricular internal cavity size was normal in size. There is  no left ventricular hypertrophy. Right Ventricle: The right ventricular size is normal. No increase in right ventricular wall thickness. Right ventricular systolic function is normal. Left Atrium: Left atrial size was normal in size. No left atrial/left atrial appendage  thrombus was detected. Right Atrium: Right atrial size was normal in size. Pericardium: There is no evidence of pericardial effusion. Mitral Valve: The mitral valve is normal in structure. Trivial mitral valve regurgitation. No evidence of mitral valve stenosis. Tricuspid Valve: The tricuspid valve is normal in structure. Tricuspid valve regurgitation is trivial. No evidence of tricuspid stenosis. Aortic Valve: The aortic valve is tricuspid. Aortic valve regurgitation is not visualized. No aortic stenosis is present. Pulmonic Valve: The pulmonic valve was grossly normal. Pulmonic valve regurgitation is not visualized. No evidence of pulmonic stenosis. Aorta: Plaque thickness up to 4 mm in the aortic arch. The aortic root and ascending aorta are structurally normal, with no evidence of dilitation and the aortic root, ascending aorta, aortic arch and descending aorta are all structurally normal, with no  evidence of dilitation or obstruction. There is moderate (Grade III) plaque involving the aortic arch and descending aorta. Venous: The inferior vena cava is normal in size with greater than 50% respiratory variability, suggesting right atrial pressure of 3 mmHg. IAS/Shunts: No atrial level shunt detected by color flow Doppler. Agitated saline contrast was given intravenously to evaluate for intracardiac shunting. Agitated saline contrast bubble study was negative, with no evidence of any interatrial shunt. Additional Comments: Spectral Doppler performed. Jerel Balding MD Electronically signed by Jerel Balding MD Signature Date/Time: 12/27/2023/10:53:57 AM    Final    EP STUDY Result Date: 12/27/2023 See surgical note for result.   Vitals:   12/27/23 1000 12/27/23 1010 12/27/23 1029 12/27/23 1233  BP: (!) 115/55 133/64 (!) 145/70 (!) 135/54  Pulse: 72 81 68 71  Resp: 17 19 18 16   Temp:    98.8 F (37.1 C)  TempSrc:    Oral  SpO2: 96% 96% 98% 98%  Weight:      Height:         PHYSICAL  EXAM: General:  Alert, well-nourished, well-developed patient in no acute distress Psych:  Mood and affect appropriate for situation CV: Regular rate and rhythm on monitor Respiratory: Regular, unlabored respirations on room air   NEURO:  Mental Status: AA&Ox3, patient is able to give clear and coherent history Speech/Language: speech is without dysarthria or aphasia.  Naming, repetition, fluency, and comprehension intact.  Cranial Nerves:  II: PERRL. Visual fields full.  III, IV, VI: EOMI. Eyelids elevate symmetrically.  V: Sensation is intact to light touch and symmetrical to face.  VII: Face is symmetrical resting and smiling VIII: hearing intact to voice. IX, X: Palate elevates symmetrically. Phonation is normal.  XI: Shoulder shrug 5/5. XII: tongue is midline without fasciculations. Motor: Strength - LUE 5/5, LLE 5/5, RUE 5/5, RLE 3/5. Unable to dorsiflex R foot.  Tone: is normal and bulk is normal Sensation- Diminished sensation in RUE. L foot paresthesias.  Extinction absent to light touch to DSS.   Coordination: FTN intact bilaterally, HKS: no ataxia in BLE. No drift.  Gait- deferred   ASSESSMENT/PLAN:  Ms. Easter Kennebrew is a 53 y.o. female with history of hypertension, hypercholesterolemia, tobacco use, depression, anxiety, arthritis admitted for right leg weakness and right foot drop and numbness. NIH on Admission 2.  Patient underwent TEE this morning. TEE unremarkable. Patient still with notable R foot drop, paresthesias. Lipoprotein A normal. OT/ST recommending outpatient follow-up. Recommend outpatient heart monitoring for arrhythmia.  Acute Ischemic Infarct: Left frontal infarct Etiology:  likely embolic, cryptogenic source CT head: Mild white matter hypodensity and focal hypodensity at the left high frontal cortex, in the region of previously noted MRI findings, reference prior report. No significant mass effect. No hemorrhage.  CTA head & neck: No large vessel  occlusion, hemodynamically significant stenosis, or aneurysm in the head or neck. Minimal atherosclerosis at the carotid bifurcations without hemodynamically significant stenosis.  Mild calcific atherosclerosis of the cavernous segments of the internal carotid arteries. MRI - performed outpatient on 10/17. Imaging/report not available in our EMR. 2D Echo EF 55-60%, normal left atrial size TCD w/ bubble study negative TEE normal LDL 167 HgbA1c 5.3 Labs: ANA, lipoprotein A negative; cardiolipin antibodies pending VTE prophylaxis - Lovenox No antithrombotic prior to admission, now on aspirin 81 mg daily and clopidogrel 75 mg daily for 3 weeks and then aspirin alone. Therapy recommendations: Outpatient PT/OT/ST Disposition: Pending  Hx of Stroke/TIA No prior history reported  Hypertension Home  meds: Losartan 25 mg AM + 50 mg PM Stable BP goal of normotension <130/80  Hyperlipidemia Home meds: None LDL 167, goal < 70 Continue atorvastatin 80 mg and Zetia 10 mg daily Continue statin at discharge; can consider PCSK9 inhibitor outpatient for better LDL control  Tobacco Abuse Patient smokes 0.5 packs per day       Ready to quit? Yes Nicotine replacement therapy provided   Hospital day # 0  Ashley Gravely, MD, PGY-1 I have personally obtained history,examined this patient, reviewed notes, independently viewed imaging studies, participated in medical decision making and plan of care.ROS completed by me personally and pertinent positives fully documented  I have made any additions or clarifications directly to the above note. Agree with note above.  Patient with cryptogenic stroke.  Recommend outpatient 30-day heart monitor.  Follow-up for lab work for hypercoagulable state and vasculitis and stroke clinic in 2 months with nurse practitioner.  Discussed with Dr. Briana.   I personally spent a total of in the care of the patient today including getting/reviewing separately obtained  history, performing a medically appropriate exam/evaluation, counseling and educating, placing orders, referring and communicating with other health care professionals, documenting clinical information in the EHR, independently interpreting results, and coordinating care.         Eather Popp, MD Medical Director [facility] Pager: [phone] 12/27/2023 2:37 PM   To contact Stroke Continuity provider, please refer to [email or web address]. After hours, contact General Neurology

## 2023-12-27 NOTE — Transfer of Care (Signed)
 Immediate Anesthesia Transfer of Care Note  Patient: Zoe Gordon  Procedure(s) Performed: TRANSESOPHAGEAL ECHOCARDIOGRAM  Patient Location: PACU  Anesthesia Type:MAC  Level of Consciousness: awake, alert , and oriented  Airway & Oxygen Therapy: Patient Spontanous Breathing and Patient connected to nasal cannula oxygen  Post-op Assessment: Report given to RN and Post -op Vital signs reviewed and stable  Post vital signs: Reviewed and stable  Last Vitals:  Vitals Value Taken Time  BP    Temp    Pulse 81 12/27/23 09:44  Resp 13 12/27/23 09:44  SpO2 96 % 12/27/23 09:44  Vitals shown include unfiled device data.  Last Pain:  Vitals:   12/27/23 0814  TempSrc: Temporal  PainSc:          Complications: No notable events documented.

## 2023-12-27 NOTE — Progress Notes (Signed)
 Communicated with patient via Caregility. Patient appears comfortable in the bed. AVS paperwork given to patient. Discharge instructions discussed with patient. Patient's question answered to satisfaction. Patient agreeable with discharge plan.

## 2023-12-27 NOTE — Op Note (Signed)
 INDICATIONS: Cryptogenic stroke.  PROCEDURE:   Informed consent was obtained prior to the procedure. The risks, benefits and alternatives for the procedure were discussed and the patient comprehended these risks.  Risks include, but are not limited to, cough, sore throat, vomiting, nausea, somnolence, esophageal and stomach trauma or perforation, bleeding, low blood pressure, aspiration, pneumonia, infection, trauma to the teeth and death.    After a procedural time-out, the oropharynx was anesthetized with 20% benzocaine spray.   During this procedure the patient was administered IV propofol by Anesthesiology, Dr. Patrisha.  The transesophageal probe was inserted in the esophagus and stomach without difficulty and multiple views were obtained.  The patient was kept under observation until the patient left the procedure room.  The patient left the procedure room in stable condition.   Agitated microbubble saline contrast was administered.  COMPLICATIONS:    There were no immediate complications.  FINDINGS:  Normal cardiac exam by TEE. Negative saline contrast study. There is moderate atherosclerotic plaque in the descending aorta and the aortic arch, more than expected for age, but without ulcerated or mobil plaque.  RECOMMENDATIONS:     Arrhythmia monitor. Lipid lowering and antiplatelet therapy.  Time Spent Directly with the Patient:  45 minutes   Bethany Cumming 12/27/2023, 9:39 AM

## 2023-12-28 ENCOUNTER — Encounter (HOSPITAL_COMMUNITY): Payer: Self-pay | Admitting: Cardiovascular Disease

## 2023-12-28 DIAGNOSIS — Z8673 Personal history of transient ischemic attack (TIA), and cerebral infarction without residual deficits: Secondary | ICD-10-CM | POA: Insufficient documentation

## 2023-12-28 LAB — CARDIOLIPIN ANTIBODIES, IGM+IGG
Anticardiolipin IgG: <9 GPL U/mL (ref 0–14)
Anticardiolipin IgM: <9 [MPL'U]/mL (ref 0–12)

## 2023-12-28 NOTE — Anesthesia Postprocedure Evaluation (Signed)
 Anesthesia Post Note  Patient: Zoe Gordon  Procedure(s) Performed: TRANSESOPHAGEAL ECHOCARDIOGRAM     Patient location during evaluation: Cath Lab Anesthesia Type: MAC Level of consciousness: awake Pain management: pain level controlled Vital Signs Assessment: post-procedure vital signs reviewed and stable Respiratory status: spontaneous breathing, nonlabored ventilation and respiratory function stable Cardiovascular status: blood pressure returned to baseline and stable Postop Assessment: no apparent nausea or vomiting Anesthetic complications: no   No notable events documented.  Last Vitals:  Vitals:   12/27/23 1029 12/27/23 1233  BP: (!) 145/70 (!) 135/54  Pulse: 68 71  Resp: 18 16  Temp:  37.1 C  SpO2: 98% 98%    Last Pain:  Vitals:   12/27/23 1233  TempSrc: Oral  PainSc:                  Bunyan Brier P Aayra Hornbaker

## 2023-12-30 NOTE — Hospital Course (Signed)
 Zoe Gordon is a 53 y.o. female with a history of hypertension, depression and anxiety.  Patient presented secondary to right leg weakness and prior to arrival MRI concerning for stroke. Neurology consulted for workup. Etiology presumed to be embolic/cryptogenic. Recommendations for patient to discharge on aspirin indefinitely and Plavix for 3 weeks.

## 2024-01-03 ENCOUNTER — Encounter (HOSPITAL_COMMUNITY): Admission: RE | Payer: Self-pay | Source: Ambulatory Visit

## 2024-01-03 ENCOUNTER — Ambulatory Visit (HOSPITAL_COMMUNITY): Admission: RE | Admit: 2024-01-03 | Source: Ambulatory Visit | Admitting: Orthopedic Surgery

## 2024-01-03 SURGERY — ARTHROPLASTY, HIP, TOTAL, ANTERIOR APPROACH
Anesthesia: Choice | Site: Hip | Laterality: Left

## 2024-01-08 ENCOUNTER — Telehealth: Payer: Self-pay | Admitting: Diagnostic Neuroimaging

## 2024-01-08 NOTE — Telephone Encounter (Signed)
 [facility] [place] Zoe Gordon) called to reschedule appointment due to was hospitalized.

## 2024-01-08 NOTE — Telephone Encounter (Signed)
 Patient called to schedule hospital follow up from being discharged, transferred patient to New Referral.

## 2024-01-12 ENCOUNTER — Telehealth: Payer: Self-pay | Admitting: Diagnostic Neuroimaging

## 2024-01-12 NOTE — Telephone Encounter (Signed)
 Pt called stating that someone reached out to Pt to schedule appt with MD 12/23 @11 ;15 / That appt was not available . Pt nor Phone rep wasn't sure who message Pt , Can u call Pt to verify  when Pt come in for New PT appt.

## 2024-01-17 NOTE — Progress Notes (Signed)
 Zoe Gordon is a 53 y.o.female.  Subjective:   This patient comes in today with multiple complaints she has been feeling exhausted after physical therapy, dizzy, cold sweat ringing in the ear bright red blood rectally as well as low blood pressure.  She is no longer on the Plavix she is only taking the aspirin after her stroke, she has a history of hemorrhoids and had noticed with wiping a couple of times that she had seen some bright red blood.  When she came home from the hospital initially she had some significant constipation she had to take something to try to facilitate a bowel movement and it took a bit of time and now she is seemingly doing a little bit better but that was concerning and she was not sure if she had hemorrhage that was going on and she is losing blood did not know it and wanted to be evaluated plus her blood pressures at home when they have been previously running extremely high have been as low as 92 systolic even as low as 88 yesterday so she cut her losartan from 100 mg a day back to half of this she had some 25 mg tablets so she took 1 in the morning and 1 in the evening which again would have been the same is just cutting her 100 mg in half I reviewed with her I would rather her stay with that 50 mg she can continue to cut the 100 mg in half that she has she has not taken the Lasix at this time either and I suspect once her blood pressure comes up that will improve also she is not really smoking anymore she is only had like 2 cigarettes which is amazingly good  Plan: After discussion today and review I am going to have her cut the losartan down to 50 mg, she is continuing with the baby aspirin I did go ahead and send her some Anusol rectal cream to see if that will help the rectal area but I do not see any evidence of significant bleeding or a concern there we will check a CBC for her and we will also check iron levels just to be thorough we discussed her physical therapy I  think she is doing amazing she is walking a little bit better still with a slight limp but not as bad of a foot drop and I think it she is going to be time with this she may be left with a slight disability hopefully not and she is continuing to work on tobacco cessation  _________________________________    Diagnoses this Encounter 1. Fatigue, unspecified type  CBC with Differential   Ferritin   Iron And Total Iron Binding Capacity   CBC with Differential   Ferritin   Iron And Total Iron Binding Capacity    2. Abnormal brain MRI  TSH    3. History of ischemic stroke      4. Right foot drop      5. Mild episode of recurrent major depressive disorder      6. Anxiety      7. Hematochezia        Chief Complaint  Patient presents with  . Fatigue    Exhausted after PT, dizziness, cold sweat, ringing in ear, bright red blood on toilet paper, low BP readings   Physical Exam Vitals reviewed.  Constitutional:      Appearance: Normal appearance.  HENT:     Head: Normocephalic.  Right Ear: Tympanic membrane normal.     Left Ear: Tympanic membrane normal.     Nose: No congestion or rhinorrhea.     Mouth/Throat:     Mouth: Mucous membranes are moist.  Eyes:     Pupils: Pupils are equal, round, and reactive to light.  Cardiovascular:     Rate and Rhythm: Normal rate and regular rhythm.     Pulses: Normal pulses.     Heart sounds: Normal heart sounds.  Pulmonary:     Effort: Pulmonary effort is normal.     Breath sounds: Normal breath sounds.  Abdominal:     Palpations: Abdomen is soft.  Genitourinary:    Comments: She had some evidence of erythema perirectal without a rash there was what appeared to be either a small skin tag or very small hemorrhoid nonthrombosed and no evidence of bleeding Musculoskeletal:        General: Normal range of motion.     Cervical back: Normal range of motion.  Skin:    General: Skin is warm and dry.     Capillary Refill: Capillary  refill takes less than 2 seconds.  Neurological:     Mental Status: She is alert and oriented to person, place, and time.  Psychiatric:        Mood and Affect: Mood normal.     ______________________ Vitals:   01/17/24 1422  BP: 112/64  BP Location: Left arm  Patient Position: Sitting  Pulse: 82  Resp: 16  Temp: 97.7 F (36.5 C)  TempSrc: Temporal  Weight: 62.1 kg (137 lb)  Height: 1.676 m (5' 6)    Patient's Medications  New Prescriptions   HYDROCORTISONE (ANUSOL-HC) 2.5 % RECTAL CREAM    Insert into the rectum 2 (two) times a day for 10 days.  Previous Medications   ALBUTEROL HFA (PROVENTIL HFA;VENTOLIN HFA;PROAIR HFA) 90 MCG/ACTUATION INHALER    Inhale 2 puffs every 4 (four) hours as needed for wheezing.   ASPIRIN 81 MG EC TABLET    Take 81 mg by mouth daily.   ATORVASTATIN (LIPITOR) 80 MG TABLET    Take 80 mg by mouth at bedtime.   CLONAZEPAM (KLONOPIN) 1 MG TABLET    Take 1 tablet (1 mg total) by mouth every 12 (twelve) hours as needed for anxiety Indications: psychiatric disorder.   EZETIMIBE (ZETIA) 10 MG TABLET    Take 10 mg by mouth daily.   FLUTICASONE PROPIONATE (FLONASE) 50 MCG/SPRAY NASAL SPRAY    Administer 1 spray into each nostril Once Daily.   LOSARTAN (COZAAR) 100 MG TABLET    Take 1 tablet (100 mg total) by mouth daily.   MELOXICAM (MOBIC) 7.5 MG TABLET    Take 7.5 mg by mouth daily as needed.   SERTRALINE (ZOLOFT) 25 MG TABLET    Take 1 tablet (25 mg total) by mouth daily.   SYRINGE WITH NEEDLE, SAFETY (MONOJECT SAFETY SYRINGES) 3 ML 25 GAUGE X 5/8 SYRG    1,000 mcg by miscellaneous route Once Daily.   SYRINGE WITH NEEDLE, SAFETY (MONOJECT SAFETY SYRINGES) 3 ML 25 GAUGE X 5/8 SYRG    1,000 mcg by miscellaneous route every 30 (thirty) days.  Modified Medications   No medications on file  Discontinued Medications   CLOPIDOGREL (PLAVIX) 75 MG TABLET    Take 75 mg by mouth daily. X 18 days   FUROSEMIDE (LASIX) 20 MG TABLET    Take 1 tablet (20 mg total) by  mouth daily.   Orders Placed  This Encounter  Procedures  . CBC with Differential  . Ferritin  . Iron And Total Iron Binding Capacity  . CBC with Differential   Problem List[1] Medical History[2] Surgical History[3] Family History[4] Social History   Socioeconomic History  . Marital status: Married    Spouse name: Not on file  . Number of children: Not on file  . Years of education: Not on file  . Highest education level: Not on file  Occupational History  . Not on file  Tobacco Use  . Smoking status: Every Day  . Smokeless tobacco: Never  Substance and Sexual Activity  . Alcohol use: Yes  . Drug use: Not on file  . Sexual activity: Not on file  Other Topics Concern  . Not on file  Social History Narrative  . Not on file   Social Drivers of Health   Food Insecurity: Low Risk  (01/17/2024)   Food vital sign   . Within the past 12 months, you worried that your food would run out before you got money to buy more: Never true   . Within the past 12 months, the food you bought just didn't last and you didn't have money to get more: Never true  Transportation Needs: No Transportation Needs (01/17/2024)   Transportation   . In the past 12 months, has lack of reliable transportation kept you from medical appointments, meetings, work or from getting things needed for daily living? : No  Safety: Low Risk  (01/17/2024)   Safety   . How often does anyone, including family and friends, physically hurt you?: Never   . How often does anyone, including family and friends, insult or talk down to you?: Never   . How often does anyone, including family and friends, threaten you with harm?: Never   . How often does anyone, including family and friends, scream or curse at you?: Never  Living Situation: Low Risk  (01/17/2024)   Living Situation   . What is your living situation today?: I have a steady place to live   . Think about the place you live. Do you have problems with any of the  following? Choose all that apply:: None/None on this list   Allergies[5]  Review of Systems  Constitutional:  Positive for fatigue. Negative for chills and fever.  HENT:  Negative for congestion, nosebleeds, postnasal drip, rhinorrhea, sinus pressure and sore throat.   Respiratory:  Negative for chest tightness and shortness of breath.   Cardiovascular:  Negative for chest pain and palpitations.  Gastrointestinal:  Positive for blood in stool. Negative for constipation and nausea.  Genitourinary:  Negative for dysuria, flank pain, frequency and urgency.  Musculoskeletal:  Negative for arthralgias.  Skin:  Negative for color change.  Neurological:  Positive for weakness. Negative for dizziness and light-headedness.  Hematological:  Negative for adenopathy.  Psychiatric/Behavioral:  Negative for agitation.          [1] Patient Active Problem List Diagnosis  . Anxiety  . Carpal tunnel syndrome on right  . Chronic sinusitis  . Malaise and fatigue  . Mixed hyperlipidemia  . Lipoma  . Major depressive disorder, recurrent (HCC)  . Multinodular goiter  . Hypokalemia  . History of left hip hemiarthroplasty  . Primary hypertension  . Left hip pain  . B12 deficiency  . Right leg weakness  . History of ischemic stroke  . Right foot drop  [2] Past Medical History: Diagnosis Date  . Allergy   . Chills   .  Constipation   . Depression   . Excessive thirst   . Fatigue   . GERD (gastroesophageal reflux disease)   . Hair loss   . Hypercholesterolemia   . Intolerance to cold    and heat  . Itching   . Joint pain   . Menstrual abnormality   . Night sweats   . Swelling of both lower extremities   . Swelling of both lower extremities   [3] Past Surgical History: Procedure Laterality Date  . BREAST SURGERY  2005   Procedure: BREAST SURGERY; augmentation  . TONSILLECTOMY  1976   Procedure: TONSILLECTOMY  . TONSILLECTOMY     Procedure: TONSILLECTOMY  [4] Family  History Problem Relation Name Age of Onset  . Hypertension Mother    . Hypertension Father    . Breast cancer Sister    . Alzheimer's disease Other    . Seizures Other    . Migraines Other    . Parkinsonism Other    . Cancer Other    . Heart disease Other    . Stroke Other    [5] Allergies Allergen Reactions  . Sulfur Dioxide Itching  . Codeine Itching  . Prednisone Swelling  . Sulfamethoxazole Itching

## 2024-01-19 DIAGNOSIS — E0590 Thyrotoxicosis, unspecified without thyrotoxic crisis or storm: Secondary | ICD-10-CM | POA: Insufficient documentation

## 2024-01-22 ENCOUNTER — Telehealth: Payer: Self-pay | Admitting: Cardiovascular Disease

## 2024-01-22 ENCOUNTER — Telehealth: Payer: Self-pay | Admitting: Neurology

## 2024-01-22 NOTE — Telephone Encounter (Signed)
 Inquiry about an earlier appointment

## 2024-01-22 NOTE — Telephone Encounter (Signed)
 Pt states she was told she would discharged from hospital (10/23)with a heart monitor, but did not receive one day of, nor has she received one in the mail. She would like to know if it will be mailed. Please advise.

## 2024-01-22 NOTE — Telephone Encounter (Signed)
 I spoke with the patient and she stated that after her hospital visit on 12/25/23 she still has not received her heart monitor that she was expected based off of discharge instructions given to her. After looking in her chart, I am not able to see where someone ordered or suggested a heart monitor for her. She does have an appointment with us  on 01/24/24 and I suggested that we wait until that appointment and that should be something we address if she needs or not. She is not experiencing any cardiac symptoms. No elevated blood pressure, chest tightness, short of breath or swelling of her legs or arms. She agrees and stated she will be here for her appointment on Wednesday.

## 2024-01-23 NOTE — Progress Notes (Unsigned)
 Cardiology Office Note:  .   Date:  01/24/2024  ID:  Zoe Gordon, DOB 11-03-1970, MRN [id number] PCP: Benson Eleanor Rung, NP  [facility] Providers Cardiologist:  None {  History of Present Illness: .   Zoe Gordon is a 53 y.o. female with history of ischemic CVA 12/2023, hypertension, hyperlipidemia, previous tobacco use.  Patient has no prior cardiac history.  History of smoking of 1 pack/day for the last 20 to 25 years but has quit.  Denied any drugs or alcohol.  Patient has recent admission for cryptogenic stroke and underwent echocardiogram and TEE which were both normal and did not show any evidence of thrombus or atrial fibrillation.  Recently seen by her PCP he on 11/12 and noted to have systemic fatigue.  PCP had ordered thyroid panel which indicated TSH of .24 and a T4 of 1.3. T3 87.  Anemia panel also obtained that demonstrated iron of 45, transferring to 157, TIBC 368 and transference saturation of 12%.  Today patient presents for hospital follow-up.  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  Additionally, has concerns that she might have DVT as she has had some asymmetric left lower leg swelling with pain in her calf at rest and with exertion.  Reports being less active in the past few days given lack of energy.  However has denied any complaints of chest pain, shortness of breath, palpitations.  ROS: Denies: Chest pain, shortness of breath, orthopnea, peripheral edema, palpitations, decreased exercise intolerance, fatigue, lightheadedness.   Studies Reviewed: SABRA    EKG Interpretation Date/Time:  Wednesday January 24 2024 10:56:00 EST Ventricular Rate:  72 PR Interval:  126 QRS Duration:  72 QT Interval:  382 QTC Calculation: 418 R Axis:   77  Text  Interpretation: Sinus rhythm with occasional Premature ventricular complexes When compared with ECG of 26-Dec-2023 01:20, PREVIOUS ECG IS PRESENT Confirmed by Darryle Currier [phone]) on 01/24/2024 11:00:21 AM    Risk Assessment/Calculations:           Physical Exam:   VS:  BP 130/62 (BP Location: Right Arm, Patient Position: Sitting, Cuff Size: Normal)   Pulse 73   Ht 5' 6 (1.676 m)   Wt 136 lb 12.8 oz (62.1 kg)   LMP  (LMP Unknown)   SpO2 97%   BMI 22.08 kg/m    Wt Readings from Last 3 Encounters:  01/24/24 136 lb 12.8 oz (62.1 kg)  12/25/23 140 lb (63.5 kg)  12/21/23 137 lb (62.1 kg)    GEN: Well nourished, well developed in no acute distress NECK: No JVD; No carotid bruits CARDIAC: RRR, no murmurs, rubs, gallops RESPIRATORY:  Clear to auscultation without rales, wheezing or rhonchi  ABDOMEN: Soft, non-tender, non-distended EXTREMITIES:  Positive Homans' sign.  Asymmetric left lower leg swelling.  No pitting edema.  ASSESSMENT AND PLAN: .    Possible DVT  Asymmetric lower leg swelling with pain with recent stroke.  Wells score for DVT is at least 4 indicating high risk for having a DVT.  Will order lower extremity Dopplers for further evaluation.  Cryptogenic stroke Does not clearly demonstrate symptoms of atrial fibrillation.  It is possible her fatigue could be related to this but on EKG today does not demonstrate atrial fibrillation and she is denying palpitations.  Will order a 30-day heart monitor to further evaluate this. TEE and echocardiogram without any obvious cardioembolic origins.  Systemic fatigue Possibly could be related to underlying thyroid dysfunction and abnormal iron panel.  Has plans to see endocrinologist.  Differential for this is extensive, additionally could be related to undiagnosed atrial fibrillation, coronary artery disease, statin use.  Will start with the above workup and if she has persistent symptoms that cannot be explained by other obvious  pathologies possibly would consider doing a coronary CTA given her family history/risk factors.  Reevaluate at follow-up appointment.  Mitral regurgitation Of note TTE had noted mild to moderate mitral regurgitation however TEE demonstrated trivial disease.  Hyperlipidemia Recently started on statin therapy.  Initially was on atorvastatin 80 mg however with her systemic fatigue PCP had lowered this to 40 mg daily.   Continue atorvastatin 40 mg daily, again, symptoms could be related to very different things including statin myalgia/fatigue.  Rule out the above and if she continues to exhibit symptoms would consider transitioning her to rosuvastatin.  If she continues to have symptoms on that then likely would need to refer to her lipid clinic for PCSK9 inhibitor. LDL was not well-controlled and was 167 12/2023.  Goal should be less than 70.  She says her PCP is managing this so I would recommend o that btaining lipid panel in 6 to 8 weeks and titrating therapy accordingly. Continue Zetia.  Hypertension Decent control 130/62 today.  Continue with losartan 50 mg daily.  Dispo: Follow-up in 4 to 6 weeks to go over heart monitor results.  Signed, Thom LITTIE Sluder, PA-C

## 2024-01-23 NOTE — Progress Notes (Signed)
 [facility] post TCM follow up ( 28 day follow up)  01/23/24 Day 28 HN called patient home # [phone] and mailbox full and can't leave a message at this time. HN will send portal message   01/23/24 Patient returned my phone call. HN spoke with patient briefly.    Date of call: 01/23/24   Discharged from: [facility]   Updates/Changes since last encounter: HN called patient for 30 day hospital follow up for Cerebral infarction, unspecified, weakness, drop foot. HN called patient home # and voicemail full and can't leave a message. HN reviewed chart and patient seen on 01/17/24 for follow up. HN will send portal message Patient seen portal message and returned my phone call. She didn't know I was reaching out to her on behalf of PCP office.  She reports she has been having some worsening tingling in the other foot that was not the one that was affected by stroke. She reports that it was going on in the hospital when she was in there. She reports that she didn't know if that was normal. She denies any new weakness to one side, slurred speech, facial droop or vision changes.  Patient reports that she has been in contact with PCP office too about her labs and she is to come back in 6 weeks to have cholesterol checked.  HN notified her usually the stroke affects opposite side and not both sides.  She said that even the doctor in the hospital said that was weird for her having tingling in the other foot.  She has an apt with cardiologist tomorrow and sees neurologist on Dec 1.  She said it may be that she had outpatient PT yesterday and the day after she seems more weak and tired and that may be what is causing this.  HN notified if she was having new s/s of stroke would been to be evaluated at hospital otherwise to keep her apt tomorrow with cardiologist.  Patient understands.  She knows to call PCP office if any new issues or concerns.  No further HN services warranted at this time.   Current  Questions/Concerns: N/A  Is patient candidate for Navigation: No  Jon Sharps, RN [facility] [facility] [phone]   Electronically signed by: Jon Earnie Sharps, RN 01/23/2024 12:53 PM

## 2024-01-24 ENCOUNTER — Ambulatory Visit
Admission: RE | Admit: 2024-01-24 | Discharge: 2024-01-24 | Disposition: A | Discharge status: Home / Self Care | Source: Ambulatory Visit | Attending: Cardiology | Admitting: Cardiology

## 2024-01-24 ENCOUNTER — Encounter: Payer: Self-pay | Admitting: Cardiology

## 2024-01-24 ENCOUNTER — Ambulatory Visit: Payer: Self-pay | Admitting: Cardiology

## 2024-01-24 ENCOUNTER — Other Ambulatory Visit: Payer: Self-pay | Admitting: Cardiology

## 2024-01-24 ENCOUNTER — Ambulatory Visit: Admitting: Cardiology

## 2024-01-24 VITALS — BP 130/62 | HR 73 | Ht 66.0 in | Wt 136.8 lb

## 2024-01-24 DIAGNOSIS — I639 Cerebral infarction, unspecified: Secondary | ICD-10-CM

## 2024-01-24 DIAGNOSIS — M7989 Other specified soft tissue disorders: Secondary | ICD-10-CM | POA: Insufficient documentation

## 2024-01-24 DIAGNOSIS — I10 Essential (primary) hypertension: Secondary | ICD-10-CM

## 2024-01-24 DIAGNOSIS — R0989 Other specified symptoms and signs involving the circulatory and respiratory systems: Secondary | ICD-10-CM | POA: Diagnosis present

## 2024-01-24 DIAGNOSIS — I82409 Acute embolism and thrombosis of unspecified deep veins of unspecified lower extremity: Secondary | ICD-10-CM

## 2024-01-24 DIAGNOSIS — E782 Mixed hyperlipidemia: Secondary | ICD-10-CM

## 2024-01-24 DIAGNOSIS — I824Y9 Acute embolism and thrombosis of unspecified deep veins of unspecified proximal lower extremity: Secondary | ICD-10-CM

## 2024-01-24 MED ORDER — ATORVASTATIN CALCIUM 40 MG PO TABS: 40.0000 mg | ORAL_TABLET | Freq: Every day | ORAL | Status: DC

## 2024-01-24 NOTE — Patient Instructions (Addendum)
 Medication Instructions:   Your physician recommends that you continue on your current medications as directed. Please refer to the Current Medication list given to you today.   *If you need a refill on your cardiac medications before your next appointment, please call your pharmacy*    Lab Work: NONE ORDERED  TODAY     If you have labs (blood work) drawn today and your tests are completely normal, you will receive your results only by: MyChart Message (if you have MyChart) OR A paper copy in the mail If you have any lab test that is abnormal or we need to change your treatment, we will call you to review the results.     Testing/Procedures:  (STAT AT AVAILABLE  LOCATION SECURE CHAT CMA /PROVIDER IF ABLE TODAY ) our physician has requested that you have a lower or upper extremity venous duplex. This test is an ultrasound of the veins in the legs or arms. It looks at venous blood flow that carries blood from the heart to the legs or arms. Allow one hour for a Lower Venous exam. Allow thirty minutes for an Upper Venous exam. There are no restrictions or special instructions.  Please note: We ask at that you not bring children with you during ultrasound (echo/ vascular) testing. Due to room size and safety concerns, children are not allowed in the ultrasound rooms during exams. Our front office staff cannot provide observation of children in our lobby area while testing is being conducted. An adult accompanying a patient to their appointment will only be allowed in the ultrasound room at the discretion of the ultrasound technician under special circumstances. We apologize for any inconvenience.    Your physician has recommended that you wear an event monitor. Event monitors are medical devices that record the heart's electrical activity. Doctors most often us  these monitors to diagnose arrhythmias. Arrhythmias are problems with the speed or rhythm of the heartbeat. The monitor is a small,  portable device. You can wear one while you do your normal daily activities. This is usually used to diagnose what is causing palpitations/syncope (passing out).    Follow-Up: At [facility], you and your health needs are our priority.  As part of our continuing mission to provide you with exceptional heart care, our providers are all part of one team.  This team includes your primary Cardiologist (physician) and Advanced Practice Providers or APPs (Physician Assistants and Nurse Practitioners) who all work together to provide you with the care you need, when you need it.  Your next appointment:   6 week(s)  Provider:  Thom Sluder   We recommend signing up for the patient portal called MyChart.  Sign up information is provided on this After Visit Summary.  MyChart is used to connect with patients for Virtual Visits (Telemedicine).  Patients are able to view lab/test results, encounter notes, upcoming appointments, etc.  Non-urgent messages can be sent to your provider as well.   To learn more about what you can do with MyChart, go to [email or web address].   Other Instructions  [employer] Cardiac Event Monitor Instructions  Your physician has requested you wear your cardiac event monitor for __30___ days, (1-30). [employer] may call or text to confirm a shipping address. The monitor will be sent to a land address via UPS. [employer] will not ship a monitor to a PO BOX. It typically takes 3-5 days to receive your monitor after it has been enrolled. [employer] will assist with USPS tracking if your  package is delayed. The telephone number for [employer] is [phone]. Once you have received your monitor, please review the enclosed instructions. Instruction tutorials can also be viewed under help and settings on the enclosed cell phone. Your monitor has already been registered assigning a specific monitor serial # to you.  Billing and Self Pay Discount Information  [employer]  has been provided the insurance information we had on file for you.  If your insurance has been updated, please call [employer] at [phone] to provide them with your updated insurance information.   [employer] offers a discounted Self Pay option for patients who have insurance that does not cover their cardiac event monitor or patients without insurance.  The discounted cost of a Self Pay Cardiac Event Monitor would be $225.00 , if the patient contacts [employer] at [phone] within 7 days of applying the monitor to make payment arrangements.  If the patient does not contact [employer] within 7 days of applying the monitor, the cost of the cardiac event monitor will be $350.00.  Applying the monitor  Remove cell phone from case and turn it on. The cell phone works as it consultant and needs to be within [facility] of you at all times. The cell phone will need to be charged on a daily basis. We recommend you plug the cell phone into the enclosed charger at your bedside table every night.  Monitor batteries: You will receive two monitor batteries labelled #1 and #2. These are your recorders. Plug battery #2 onto the second connection on the enclosed charger. Keep one battery on the charger at all times. This will keep the monitor battery deactivated. It will also keep it fully charged for when you need to switch your monitor batteries. A small light will be blinking on the battery emblem when it is charging. The light on the battery emblem will remain on when the battery is fully charged.  Open package of a Monitor strip. Insert battery #1 into black hood on strip and gently squeeze monitor battery onto connection as indicated in instruction booklet. Set aside while preparing skin.  Choose location for your strip, vertical or horizontal, as indicated in the instruction booklet. Shave to remove all hair from location. There cannot be any lotions, oils, powders, or colognes on skin where  monitor is to be applied. Wipe skin clean with enclosed Saline wipe. Dry skin completely.  Peel paper labeled #1 off the back of the Monitor strip exposing the adhesive. Place the monitor on the chest in the vertical or horizontal position shown in the instruction booklet. One arrow on the monitor strip must be pointing upward. Carefully remove paper labeled #2, attaching remainder of strip to your skin. Try not to create any folds or wrinkles in the strip as you apply it.  Firmly press and release the circle in the center of the monitor battery. You will hear a small beep. This is turning the monitor battery on. The heart emblem on the monitor battery will light up every 5 seconds if the monitor battery in turned on and connected to the patient securely. Do not push and hold the circle down as this turns the monitor battery off. The cell phone will locate the monitor battery. A screen will appear on the cell phone checking the connection of your monitor strip. This may read poor connection initially but change to good connection within the next minute. Once your monitor accepts the connection you will hear a series of 3 beeps followed by  a climbing crescendo of beeps. A screen will appear on the cell phone showing the two monitor strip placement options. Touch the picture that demonstrates where you applied the monitor strip.  Your monitor strip and battery are waterproof. You are able to shower, bathe, or swim with the monitor on. They just ask you do not submerge deeper than 3 feet underwater. We recommend removing the monitor if you are swimming in a lake, river, or ocean.  Your monitor battery will need to be switched to a fully charged monitor battery approximately once a week. The cell phone will alert you of an action which needs to be made.  On the cell phone, tap for details to reveal connection status, monitor battery status, and cell phone battery status. The green dots indicates  your monitor is in good status. A red dot indicates there is something that needs your attention.  To record a symptom, click the circle on the monitor battery. In 30-60 seconds a list of symptoms will appear on the cell phone. Select your symptom and tap save. Your monitor will record a sustained or significant arrhythmia regardless of you clicking the button. Some patients do not feel the heart rhythm irregularities. [employer] will notify us  of any serious or critical events.  Refer to instruction booklet for instructions on switching batteries, changing strips, the Do not disturb or Pause features, or any additional questions.  Call [employer] at [phone], to confirm your monitor is transmitting and record your baseline. They will answer any questions you may have regarding the monitor instructions at that time.  Returning the monitor to [employer]  Place all equipment back into blue box. Peel off strip of paper to expose adhesive and close box securely. There is a prepaid UPS shipping label on this box. Drop in a UPS drop box, or at a UPS facility like [employer]. You may also contact [employer] to arrange UPS to pick up monitor package at your home.

## 2024-01-25 ENCOUNTER — Encounter: Payer: Self-pay | Admitting: *Deleted

## 2024-01-25 NOTE — Progress Notes (Signed)
 Patient enrolled for [employer]/ [employer] to ship a 30 day cardiac event monitor to her address on file.

## 2024-01-31 NOTE — Progress Notes (Signed)
 [facility] [address] [place]. [place] [address] [phone]       HOSPITAL FOLLOW UP NOTE  [name] Date of Birth:  04/29/1970 Medical Record Number:  [id number]   Reason for Referral:  hospital stroke follow up    SUBJECTIVE:   CHIEF COMPLAINT:  Chief Complaint  Patient presents with   Cerebrovascular Accident    Rm 3 alone Pt is well, reports residual R sided weakness and fatigue.     HPI:   Ms. Zoe Gordon is a 53 y.o. female with history of hypertension, hypercholesterolemia, tobacco use, depression, anxiety, arthritis who presented to ED on 12/25/2023 with 1 week history of progressive right leg weakness and right foot drop and numbness.  Stroke workup revealed left frontal infarct, likely embolic due to unknown source.  Further workup noted below.  Recommended outpatient cardiac monitor to evaluate for arrhythmia.  Recommended DAPT for 3 weeks and aspirin  alone.  Initiated atorvastatin  and Zetia  for LDL 167.  Tobacco cessation counseling provided.  Therapies recommended outpatient therapy.     Today, 02/05/2024, patient is being seen for initial hospital follow-up unaccompanied.  Reports gradual improvement of RLE weakness but still experiencing mild foot drop.  Able to ambulate without assistive device. 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  She also mentions episodes of right arm tremor and shortness of breath, no loss of consciousness or other seizure like activity, resolves after sitting and resting, feels related to elevated HR. PCP ordered thyroid panel which indicated TSH of .24 and a T4 of 1.3. T3 87.  Anemia panel also obtained that demonstrated iron of 45, transferring to 157, TIBC 368 and transference saturation of 12%.  She is waiting to be seen by  endocrinology and started on iron supplement 2 to 3 days ago.  She is working with PT but experiences increased fatigue that day and the following day which overall limits overall activity.  Was seen by cardiology 2 weeks ago and ordered cardiac monitor to rule out A-fib, just started over this past weekend with plans on 30-day duration.  PCP also decreased atorvastatin  to 40 mg daily which she feels improved symptoms some.  Notes left foot numbness since hospitalization, primarily on the top of her toes and top of foot radiating to ankle, feels possibly some progression since onset.  Denies low back pain or radiculopathy symptoms.  She does admit to having increased anxiety since her stroke as well as frustration regarding continued multiple issues as noted above without clear causes.   Completed 3 weeks DAPT, remains on aspirin  alone as well as atorvastatin .  Routinely follows with PCP for stroke risk factor management.  Neck scheduled follow-up visit in August 2026 but plans on scheduling sooner follow-up visit.  Follow-up visit with cardiology next month.  Reports complete tobacco cessation since discharge.      PERTINENT IMAGING  CT head: Mild white matter hypodensity and focal hypodensity at the left high frontal cortex, in the region of previously noted MRI findings, reference prior report. No significant mass effect. No hemorrhage.  CTA head & neck: No large vessel occlusion, hemodynamically significant stenosis, or aneurysm in the head or neck. Minimal atherosclerosis at the carotid bifurcations without hemodynamically significant stenosis.  Mild calcific atherosclerosis of the cavernous segments of the internal carotid arteries. MRI -  performed outpatient on 10/17. Imaging/report not available in our EMR. 2D Echo EF 55-60%, normal left atrial size TCD w/ bubble study negative TEE normal LDL 167 HgbA1c 5.3 Labs: ANA, lipoprotein A negative; cardiolipin antibodies neg     ROS:   14  system review of systems performed and negative with exception of those listed in HPI  PMH:  Past Medical History:  Diagnosis Date   Anxiety    Arthritis    Cancer (HCC)    basal cell lesion on abdomen   Complication of anesthesia    hard to wake up, very jittery   Depression    Hypertension     PSH:  Past Surgical History:  Procedure Laterality Date   BREAST SURGERY Bilateral 2000   breast implants, augmentation  [facility]   HEMIARTHROPLASTY HIP Left 2021   done at [facility]     bone graft to prepare for implant   TONSILLECTOMY     TRANSESOPHAGEAL ECHOCARDIOGRAM (CATH LAB) N/A 12/27/2023   Procedure: TRANSESOPHAGEAL ECHOCARDIOGRAM;  Surgeon: Francyne Headland, MD;  Location: [facility] INVASIVE CV LAB;  Service: Cardiovascular;  Laterality: N/A;   WISDOM TOOTH EXTRACTION      Social History:  Social History   Socioeconomic History   Marital status: Single    Spouse name: Not on file   Number of children: Not on file   Years of education: Not on file   Highest education level: Not on file  Occupational History   Not on file  Tobacco Use   Smoking status: Former    Average packs/day: 0.5 packs/day for 34.7 years (17.4 ttl pk-yrs)    Types: Cigarettes    Start date: 1   Smokeless tobacco: Never  Vaping Use   Vaping status: Never Used  Substance and Sexual Activity   Alcohol use: Yes    Comment: social   Drug use: Not Currently   Sexual activity: Yes    Birth control/protection: Post-menopausal  Other Topics Concern   Not on file  Social History Narrative   Not on file   Social Drivers of Health   Financial Resource Strain: Not on file  Food Insecurity: Low Risk  (01/17/2024)   Received from [facility]   Hunger Vital Sign    Within the past 12 months, you worried that your food would run out before you got money to buy more: Never true    Within the past 12 months, the food you bought just didn't last and you didn't have money to  get more. : Never true  Transportation Needs: No Transportation Needs (01/17/2024)   Received from [employer]    In the past 12 months, has lack of reliable transportation kept you from medical appointments, meetings, work or from getting things needed for daily living? : No  Physical Activity: Not on file  Stress: Not on file  Social Connections: Moderately Isolated (12/26/2023)   Social Connection and Isolation Panel    Frequency of Communication with Friends and Family: Once a week    Frequency of Social Gatherings with Friends and Family: Once a week    Attends Religious Services: 1 to 4 times per year    Active Member of [employer] or Organizations: Yes    Attends Banker Meetings: 1 to 4 times per year    Marital Status: Divorced  Intimate Partner Violence: Not At Risk (12/26/2023)   Humiliation, Afraid, Rape, and Kick questionnaire  Fear of Current or Ex-Partner: No    Emotionally Abused: No    Physically Abused: No    Sexually Abused: No    Family History:  Family History  Problem Relation Age of Onset   Breast cancer Sister 26       once and 21 & again at 45    Medications:   Current Outpatient Medications on File Prior to Visit  Medication Sig Dispense Refill   aspirin  EC 81 MG tablet Take 1 tablet (81 mg total) by mouth daily. Swallow whole. 90 tablet 0   atorvastatin  (LIPITOR ) 40 MG tablet Take 1 tablet (40 mg total) by mouth daily.     clonazePAM  (KLONOPIN ) 1 MG tablet Take 1 mg by mouth daily as needed for anxiety.     ezetimibe  (ZETIA ) 10 MG tablet Take 1 tablet (10 mg total) by mouth daily. 90 tablet 0   fluticasone  (FLONASE ) 50 MCG/ACT nasal spray Place 2 sprays into both nostrils daily. (Patient taking differently: Place 2 sprays into both nostrils daily as needed for allergies or rhinitis.)     losartan  (COZAAR ) 50 MG tablet Take 50 mg by mouth daily. Take 25 mg by mouth in the morning and 50 mg in the evening     No current  facility-administered medications on file prior to visit.    Allergies:   Allergies  Allergen Reactions   Sulfamethoxazole Itching   Codeine Itching   Shellfish Allergy Swelling    Eyes swell shut   Strawberry (Diagnostic) Hives and Itching      OBJECTIVE:  Physical Exam  Vitals:   02/05/24 0825  BP: (!) 141/78  Pulse: 94  Weight: 138 lb (62.6 kg)  Height: 5' 6 (1.676 m)   Body mass index is 22.27 kg/m. No results found.   General: well developed, well nourished, pleasant middle-age Caucasian female, seated, in no evident distress  Neurologic Exam Mental Status: Awake and fully alert.  Fluent speech and language.  Oriented to place and time. Recent and remote memory intact. Attention span, concentration and fund of knowledge appropriate. Mood and affect appropriate.  Cranial Nerves: Pupils equal, briskly reactive to light. Extraocular movements full without nystagmus. Visual fields full to confrontation. Hearing intact. Facial sensation intact. Face, tongue, palate moves normally and symmetrically.  Motor: Normal bulk and tone. Normal strength in all tested extremity muscles except 3+/5-4/5 ADF Sensory.: intact to touch , pinprick , position and vibratory sensation except decreased sensation top of left foot.  Coordination: Rapid alternating movements normal in all extremities. Finger-to-nose and heel-to-shin performed accurately bilaterally. Gait and Station: Arises from chair without difficulty. Stance is normal. Gait demonstrates decreased stride length and step height with RLE, no use of AD.  Reflexes: 1+ and symmetric. Toes downgoing.         ASSESSMENT: Zoe Gordon is a 53 y.o. 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 Vascular risk factors include HTN, HLD, and tobacco use.      PLAN:  Cryptogenic stroke:  Residual deficit: RLE weakness although greatly improving since discharge. Continue to work with PT,  discussed typical recovery time.  Continue aspirin  81mg  daily and atorvastatin  (Lipitor ) and Zetia  10mg  daily for secondary stroke prevention managed/prescribed by PCP.   Completed 30 day cardiac monitor. Consider ILR if negative  Discussed secondary stroke prevention measures and importance of close PCP follow up for aggressive stroke risk factor management including BP goal<130/90, HLD with LDL goal<70 and DM with A1c.<7 .  Stroke labs 12/2023: LDL 167, A1c 5.3 - recommend repeat with PCP in the next 6-8 weeks I have gone over the pathophysiology of stroke, warning signs and symptoms, risk factors and their management in some detail with instructions to go to the closest emergency room for symptoms of concern.  Systemic fatigue: Unclear etiology but suspect multifactoral in setting of recent stroke, underlying thyroid dysfunction, abnormal iron panel, statin use and underlying anxiety. Does have hx of B12 deficiency with recent B12 level 510 Scheduled to see endocrinology next month Advised to continue iron supplement If post stroke, discussed typical recovery time Can consider holding statin for 5-7 days. If symptoms improve, consider changing atorvastatin  to rosuvastatin.  Discussed earlier evaluation for underlying sleep disorder also possibly contributing to stroke, currently wearing cardiac monitor, may have to wait until completed to pursue. She will call company and will call if able to complete together Continue to follow with cardiology as scheduled who plans on pursuing further cardiac work up if symptoms persist  Will follow up with Dr. Rosemarie for further recommendations   Left foot numbness: DDx peroneal nerve compression/entrapment vs peripheral neuropathy from thyroid dysfunction vs lumbar radiculopathy Follow up with endocrinology next month for thyroid dysfunction MRI L spine 12/2023 ([facility]) no evidence of nerve root compression Consider pursuing EMG/NCV if symptoms  persist/worsen  Will follow up with Dr. Rosemarie for further recommendations   ADDENDUM 02/06/2024 JM: Per Dr. Rosemarie, multifocal symptoms possibly related to underlying anxiety.  No further recommendations at this time.      Follow up in 3 months with Dr. Rosemarie for further evaluation or call earlier if needed   CC:  [facility] provider: Dr. Rosemarie PCP: Benson Eleanor Rung, NP    I personally spent a total of 70 minutes in the care of the patient today including preparing to see the patient, getting/reviewing separately obtained history, performing a medically appropriate exam/evaluation, counseling and educating, referring and communicating with other health care professionals, and documenting clinical information in the EHR.  Harlene Bogaert, AGNP-BC  [facility] [address] [place], [place] [address]  Phone [phone] Fax [phone] Note: This document was prepared with digital dictation and possible smart phrase technology. Any transcriptional errors that result from this process are unintentional.

## 2024-02-05 ENCOUNTER — Encounter: Payer: Self-pay | Admitting: Adult Health

## 2024-02-05 ENCOUNTER — Ambulatory Visit: Admitting: Adult Health

## 2024-02-05 VITALS — BP 141/78 | HR 94 | Ht 66.0 in | Wt 138.0 lb

## 2024-02-05 DIAGNOSIS — R200 Anesthesia of skin: Secondary | ICD-10-CM

## 2024-02-05 DIAGNOSIS — I639 Cerebral infarction, unspecified: Secondary | ICD-10-CM

## 2024-02-05 DIAGNOSIS — R5383 Other fatigue: Secondary | ICD-10-CM

## 2024-02-05 DIAGNOSIS — Z09 Encounter for follow-up examination after completed treatment for conditions other than malignant neoplasm: Secondary | ICD-10-CM

## 2024-02-05 NOTE — Progress Notes (Signed)
 I agree with the above plan

## 2024-02-05 NOTE — Patient Instructions (Signed)
 Continue to work with PT for hopeful ongoing recovery  Consider pursuing sleep study to rule out underlying sleep apnea contributing to fatigue  Can hold statin for 5-7 days, if symptoms improve can switch to Crestor. This can be completed by PCP or cardiology.   Complete cardiac monitor to rule out underlying arrhythmia  If left foot symptoms worsen, can consider pursuing EMG/NCV  Continue aspirin  81 mg daily, atorvastatin  and Zetia  for secondary stroke prevention  Continue to follow up with PCP regarding blood pressure and cholesterol management  Maintain strict control of hypertension with blood pressure goal below 130/90 and cholesterol with LDL cholesterol (bad cholesterol) goal below 70 mg/dL.   Signs of a Stroke? Follow the BEFAST method:  Balance Watch for a sudden loss of balance, trouble with coordination or vertigo Eyes Is there a sudden loss of vision in one or both eyes? Or double vision?  Face: Ask the person to smile. Does one side of the face droop or is it numb?  Arms: Ask the person to raise both arms. Does one arm drift downward? Is there weakness or numbness of a leg? Speech: Ask the person to repeat a simple phrase. Does the speech sound slurred/strange? Is the person confused ? Time: If you observe any of these signs, call 911.      Followup in the future with Dr. Rosemarie in 3 months or call earlier if needed       Thank you for coming to see us  at [facility]. I hope we have been able to provide you high quality care today.  You may receive a patient satisfaction survey over the next few weeks. We would appreciate your feedback and comments so that we may continue to improve ourselves and the health of our patients.

## 2024-02-07 ENCOUNTER — Encounter: Payer: Self-pay | Admitting: Adult Health

## 2024-02-07 DIAGNOSIS — I639 Cerebral infarction, unspecified: Secondary | ICD-10-CM

## 2024-02-07 DIAGNOSIS — R5383 Other fatigue: Secondary | ICD-10-CM

## 2024-02-13 MED ORDER — ROSUVASTATIN CALCIUM 10 MG PO TABS: 10.0000 mg | ORAL_TABLET | Freq: Every day | ORAL | 5 refills | Status: DC

## 2024-02-13 NOTE — Addendum Note (Signed)
 Addended by: WHITFIELD RAISIN L on: 02/13/2024 10:11 AM   Modules accepted: Orders

## 2024-02-14 NOTE — Addendum Note (Signed)
 Addended by: WHITFIELD RAISIN L on: 02/14/2024 07:48 AM   Modules accepted: Orders

## 2024-02-22 ENCOUNTER — Ambulatory Visit: Admitting: Podiatry

## 2024-02-22 DIAGNOSIS — M25571 Pain in right ankle and joints of right foot: Secondary | ICD-10-CM | POA: Diagnosis not present

## 2024-02-22 DIAGNOSIS — G609 Hereditary and idiopathic neuropathy, unspecified: Secondary | ICD-10-CM

## 2024-02-22 DIAGNOSIS — M25572 Pain in left ankle and joints of left foot: Secondary | ICD-10-CM | POA: Diagnosis not present

## 2024-02-22 DIAGNOSIS — B351 Tinea unguium: Secondary | ICD-10-CM

## 2024-02-22 DIAGNOSIS — M6281 Muscle weakness (generalized): Secondary | ICD-10-CM

## 2024-02-22 NOTE — Progress Notes (Signed)
 Chief Complaint  Patient presents with   Peripheral Neuropathy    She had a Stroke in Oct, and while in the hospital she told them that she had a numb spot, with tingling and burning in 4 toes on the left foot. She does have some tingling in the right foot. She does have some fungal nails and callous build up on the left foot as well. I did tell her we can address this at another app due to  time.  Not diabetic, ASA    Discussed the use of AI scribe software for clinical note transcription with the patient, who gave verbal consent to proceed.  History of Present Illness Zoe Gordon is a 53 year old female with a history of stroke who presents with worsening neuropathy symptoms.  She experienced a stroke on October 20th, initially resulting in loss of mobility in her right toes and leg. Over time, she regained mobility, but now reports new symptoms of numbness and tingling in her left foot, which began in the toes and has spread to the dorsal aspect of the foot. These symptoms have developed over the past three weeks and are concerning to her.  While hospitalized, she noticed a patch of numbness on the top of her left foot, which she communicated to the medical staff. She is unsure if these symptoms are related to her previous stroke. She describes the sensation as 'weird' and notes that her whole body is tingling, which she associates with a condition known as paresthesia post-stroke.  She has low iron and B12 levels, for which she receives B12 injections. Her last B12 level was around 500, which she stated is low. She is unsure of her vitamin D  levels and states she has not undergone any comprehensive panel of tests to rule out other causes of her neuropathy symptoms.  She mentions a popping sensation in her foot when flexing her toes and describes a feeling of weakness in her right side, which she attributes to her stroke. She underwent physical therapy twice a week for seven to eight  weeks and continues exercises at home.  She expresses concern about the rapid progression of her symptoms on the left dorsal foot and is worried about the potential for neuropathy. She has not been exposed to any new medications or environmental factors that could explain her symptoms. She also mentions a history of hyperthyroidism, which is being managed by her endocrinologist.     Past Medical History:  Diagnosis Date   Anxiety    Arthritis    Cancer (HCC)    basal cell lesion on abdomen   Complication of anesthesia    hard to wake up, very jittery   Depression    Hypertension    Past Surgical History:  Procedure Laterality Date   BREAST SURGERY Bilateral 2000   breast implants, augmentation  done at [facility]   HEMIARTHROPLASTY HIP Left 2021   done at [facility]     bone graft to prepare for implant   TONSILLECTOMY     TRANSESOPHAGEAL ECHOCARDIOGRAM (CATH LAB) N/A 12/27/2023   Procedure: TRANSESOPHAGEAL ECHOCARDIOGRAM;  Surgeon: Francyne Headland, MD;  Location: [facility] INVASIVE CV LAB;  Service: Cardiovascular;  Laterality: N/A;   WISDOM TOOTH EXTRACTION     Allergies[1]  Physical Exam MUSCULOSKELETAL: Small delve in the skin along the proximal left lateral leg near the superior fibular head. No weakness of the peroneal tendons bilaterally. NEUROLOGICAL: Decreased light touch sensation to the toes on  the left foot and dorsal aspect of the first interspace on the left foot. Diminished vibratory sensation to the first MPJ bilaterally. Temperature sensation intact bilaterally. Weakness with dorsiflexion and plantarflexion on the right. Dermatological: No mottling of the skin noted.  No swelling appreciated.  The toenails do show some yellow discoloration and onycholysis mildly.  Some thickness and crumbling of the distal portion of the nails is noted.  Previous Labs: (10/16 through 12/25/2023) previous labs show a vitamin B12 value of 510, a positive serum pregnancy  test which was repeated revealing an hCG quantitative level of 4, normal iron level, normal CBC, negative HIV, A1c within normal range, lipoprotein a value of 72.1 which is elevated, negative ANA with reflex, and anticardiolipin IgG of less than 9 (negative)   Assessment/Plan of Care: 1. Idiopathic peripheral neuropathy   2. Pain in joint involving right ankle and foot   3. Pain in joint involving ankle and foot, left   4. Dermatophytosis of nail   5. Right-sided muscle weakness     Assessment & Plan Idiopathic peripheral neuropathy Decreased sensation in the left dorsal foot and toes, with tingling and numbness. Symptoms have progressed over three weeks. Differential includes post-stroke neuropathy, systemic causes, or autoimmune etiology. EMG scheduled for February to assess nerve function. Blood work planned to rule out systemic causes. No recent trauma or new medications identified as potential causes. Thyroid function is monitored by endocrinologist, with no indication of hypothyroidism contributing to symptoms. - Proceed with scheduled EMG in February. - Ordered blood work to rule out systemic causes of neuropathy. - Advised her to call for cancellations to potentially move up EMG appointment. - Discussed potential for temporary neuropathy and need for further testing to determine cause.  Onychomycosis of toenails Fungal infection of toenails, particularly the right hallux and left great toenail, with black and green discoloration. Mild severity. She prefers topical treatment due to current health concerns and lack of recent blood work. - Prescribed Tolcylen solution for topical application to all toenails.  She purchased this at checkout today.  Pain in joints of bilateral ankles and feet Pain in bilateral toes, with concern for potential autoimmune etiology. No definitive diagnosis yet. Joint redness noted, possibly due to shoe pressure or possibly autoimmune issues. Blood work planned  to assess for autoimmune markers. - Ordered blood work to assess for autoimmune markers. - Advised her to monitor joint symptoms and report any changes.  Will call patient to discuss lab results.  Will also await her nerve conduction study which is scheduled for February.  She will need to request that the results also be faxed to our office since we were not the ordering provider.  Awanda CHARM Imperial, DPM, FACFAS [facility]     [address] [place], [place] [address]                Office [phone]  Fax [phone]      [1]  Allergies Allergen Reactions   Sulfamethoxazole Itching   Codeine Itching   Shellfish Allergy Swelling    Eyes swell shut   Strawberry (Diagnostic) Hives and Itching

## 2024-02-23 ENCOUNTER — Ambulatory Visit: Admitting: Neurology

## 2024-02-23 DIAGNOSIS — R5383 Other fatigue: Secondary | ICD-10-CM

## 2024-02-23 DIAGNOSIS — G4733 Obstructive sleep apnea (adult) (pediatric): Secondary | ICD-10-CM

## 2024-02-23 DIAGNOSIS — I639 Cerebral infarction, unspecified: Secondary | ICD-10-CM

## 2024-02-27 ENCOUNTER — Encounter: Payer: Self-pay | Admitting: Adult Health

## 2024-03-02 LAB — ANA W/REFLEX: ANA Titer 1: NEGATIVE

## 2024-03-02 LAB — SEDIMENTATION RATE: Sed Rate: 7 mm/h (ref 0–40)

## 2024-03-02 LAB — HLA-B27 ANTIGEN: HLA B27: NEGATIVE

## 2024-03-02 LAB — RHEUMATOID FACTOR: Rheumatoid fact SerPl-aCnc: <10 [IU]/mL

## 2024-03-02 LAB — VITAMIN D 1,25 DIHYDROXY
Vitamin D 1, 25 (OH)2 Total: 63 pg/mL
Vitamin D2 1, 25 (OH)2: <10 pg/mL
Vitamin D3 1, 25 (OH)2: 58 pg/mL

## 2024-03-02 LAB — VITAMIN B6: Vitamin B6: 2.6 ug/L — ABNORMAL LOW (ref 3.4–65.2)

## 2024-03-02 LAB — LYME DISEASE SEROLOGY W/REFLEX: Lyme Total Antibody EIA: NEGATIVE

## 2024-03-03 ENCOUNTER — Ambulatory Visit: Payer: Self-pay | Admitting: Podiatry

## 2024-03-05 ENCOUNTER — Ambulatory Visit: Attending: Cardiology

## 2024-03-05 DIAGNOSIS — I639 Cerebral infarction, unspecified: Secondary | ICD-10-CM

## 2024-03-06 ENCOUNTER — Ambulatory Visit: Payer: Self-pay | Admitting: Cardiology

## 2024-03-06 DIAGNOSIS — I639 Cerebral infarction, unspecified: Secondary | ICD-10-CM | POA: Diagnosis not present

## 2024-03-06 NOTE — Progress Notes (Signed)
 "      [facility]  Zoe Gordon 53 year old female Jul 24, 1970 HOME SLEEP TEST REPORT ( by Elene  mail -out device )    Study Protocol:     The SANSA chest-worn sensor is an FDA cleared and DOT approved type 4 home sleep test device - it measures eight physiological channels,  including blood oxygen saturation (measured via PPG [photoplethysmography]), EKG-derived heart rate, respiratory effort, chest movement (measured via accelerometer), snoring, body position, and actigraphy.  While the Sansa device now detects REM sleep , it has not been able to calculate a REM sleep AHI.    STUDY DATE:  02-23-2024 Data received : 03-06-2024  ORDERING CLINICIAN:  Whitfield, NP  REFERRING CLINICIAN: Eather Popp, MD    CLINICAL INFORMATION/HISTORY:   Zoe Gordon is a 53 y.o. 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 Vascular risk factors include HTN, HLD, and tobacco use.   02-05-2024, patient seen by NP McCue for cryptogenic stroke,  age 37, caucasian,  BMI 22.3. right residual weakness.  Today, 02/05/2024, patient is being seen for initial hospital follow-up unaccompanied.  Reports gradual improvement of RLE weakness but still experiencing mild foot drop.  Able to ambulate without assistive device. 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  She also mentions episodes of right arm tremor and shortness of breath, no loss of consciousness or other seizure like activity, resolves after sitting and resting, feels related to elevated HR.  PCP ordered thyroid panel which indicated TSH of .24 and a T4 of 1.3. T3 87.  Anemia panel also obtained that demonstrated iron of 45, transferring to 157, TIBC 368 and transference saturation of 12%.  She is waiting to be seen by endocrinology and started on  iron supplement 2 to 3 days ago.  She is working with PT but experiences increased fatigue that day and the following day which overall limits overall activity.  Was seen by cardiology 2 weeks ago and ordered cardiac monitor to rule out A-fib, just started over this past weekend with plans on 30-day duration.  PCP also decreased atorvastatin  to 40 mg daily which she feels improved symptoms some.  Notes left foot numbness since hospitalization, primarily on the top of her toes and top of foot radiating to ankle, feels possibly some progression since onset.  Denies low back pain or radiculopathy symptoms.  She does admit to having increased anxiety since her stroke as well as frustration regarding continued multiple issues as noted above without clear causes.    Completed 3 weeks DAPT, remains on aspirin  alone as well as atorvastatin .  Routinely follows with PCP for stroke risk factor management.  Neck scheduled follow-up visit in August 2026 but plans on scheduling sooner follow-up visit.  Follow-up visit with cardiology next month.  Reports complete tobacco cessation since discharge.      Epworth sleepiness score:   X /24. FFS at  X / 63 points   BMI: 22.3 kg/m  Neck Circumference: not measured.     Sleep Summary:   Start of  Recording Time (hours, min):   02-26-2024. 21: 56 hours     Total Sleep Time (hours, min):  5 h 29 m   Sleep efficiency in % : 79%  REM sleep captured : over 1 hour  Respiratory Indices ( AHI ) by AASM  criteria of scoring / Calculated pAHI (per hour):  13.8/h                                                Positional  respiratory activity  / snoring : supine and prone sleep were associated with high AHI ( 16.8/h and 18.3/h)  Lateral sleep was associated with low AHI < 5/h.  REM sleep in supine was showing the highest number of apneas and hypopneas, and snoring activity as well.   Oxygen Saturation during Sleep:   Oxygen  Saturation ( in %) : Mean at 95.5 % with an O2 Saturation Range (%) between a nadir of 73% and max. saturation of 100%.                                   O2 Saturation time (minutes) <89%: 0 minutes          Pulse Rate during Sleep :   Pulse Mean (bpm) was 69 bpm in regular rhythm with isolated PVCs. Pulse Range was between 53  bpm and 92 bpm.,                 IMPRESSION:  This HST confirms the presence of mild sleep apnea which was exacerbated by supine and prone sleep position. No sleep hypoxia was seen.   REM sleep was associated with clusters of apneas and hypopneas as seen in obstructive sleep apnea, but usually associated  with high BMI individuals. These clusters were only present while in supine sleep position.       RECOMMENDATION: Ms. Fedele should avoid the prone and supine sleep positions and change her sleep- position to lateral ( left/ right) or sleep with head of bed elevated ( upright) .   According to the data of the HST, the patient did not suffer apneas or hypopneas when not in REM sleep while also in supine or prone sleep position.   Depending on the patient's ability to sleep not  in the above named positions (and rather upright)  may recommend additional positive airway pressure therapy ( CPAP ) as well:  Note to Stroke MD/ NP McCue : If your patient cannot avoid supine sleep, please start on auto CPAP ResMed 5-15 cm water pressure 1 cm EPR and heated humidity, mask to be fitted.    PS : Weight loss is not needed in a person with this BMI ( 22.3) and I noted that smoking cessation has taken place.     Any Patient endorsing a high level of sleepiness should be cautioned not to drive, work at heights, or operate dangerous machinery or heavy equipment when tired or sleepy.  Review of good sleep hygiene measures took place in the initial consultation but should be revisited ( Your guide to better sleep  a publication by the NIH is a good source of information).    The referring provider will be notified of the test results.    I certify that I have reviewed the raw data recording prior to the issuance of this report in accordance with the standards of the American Academy of Sleep Medicine (AASM).    INTERPRETING PHYSICIAN: Dedra Gores, MD  [facility] and [facility] Sleep Board certified by The American  Board of Sleep Medicine and Diplomate of the American Academy of Sleep Medicine. Board certified In Neurology through the ABPN, Fellow of the [employer].            "

## 2024-03-09 DIAGNOSIS — I639 Cerebral infarction, unspecified: Secondary | ICD-10-CM | POA: Insufficient documentation

## 2024-03-09 NOTE — Procedures (Signed)
 "       [facility]  Zoe Gordon 54 year old female 03-Sep-1970 HOME SLEEP TEST REPORT ( by Elene  mail -out device )    Study Protocol:     The SANSA chest-worn sensor is an FDA cleared and DOT approved type 4 home sleep test device - it measures eight physiological channels,  including blood oxygen saturation (measured via PPG [photoplethysmography]), EKG-derived heart rate, respiratory effort, chest movement (measured via accelerometer), snoring, body position, and actigraphy.  While the Sansa device now detects REM sleep , it has not been able to calculate a REM sleep AHI.    STUDY DATE:  02-23-2024 Data received : 03-06-2024  ORDERING CLINICIAN:  Whitfield, NP  REFERRING CLINICIAN: Eather Popp, MD    CLINICAL INFORMATION/HISTORY:   Zoe Gordon is a 54 y.o. 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 Vascular risk factors include HTN, HLD, and tobacco use.   02-05-2024, patient seen by NP McCue for cryptogenic stroke,  age 35, caucasian,  BMI 22.3. right residual weakness.  Today, 02/05/2024, patient is being seen for initial hospital follow-up unaccompanied.  Reports gradual improvement of RLE weakness but still experiencing mild foot drop.  Able to ambulate without assistive device. 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  She also mentions episodes of right arm tremor and shortness of breath, no loss of consciousness or other seizure like activity, resolves after sitting and resting, feels related to elevated HR.  PCP ordered thyroid panel which indicated TSH of .24 and a T4 of 1.3. T3 87.  Anemia panel also obtained that demonstrated iron of 45, transferring to 157, TIBC 368 and transference saturation of 12%.  She is waiting to be seen by endocrinology and started  on iron supplement 2 to 3 days ago.  She is working with PT but experiences increased fatigue that day and the following day which overall limits overall activity.  Was seen by cardiology 2 weeks ago and ordered cardiac monitor to rule out A-fib, just started over this past weekend with plans on 30-day duration.  PCP also decreased atorvastatin  to 40 mg daily which she feels improved symptoms some.  Notes left foot numbness since hospitalization, primarily on the top of her toes and top of foot radiating to ankle, feels possibly some progression since onset.  Denies low back pain or radiculopathy symptoms.  She does admit to having increased anxiety since her stroke as well as frustration regarding continued multiple issues as noted above without clear causes.    Completed 3 weeks DAPT, remains on aspirin  alone as well as atorvastatin .  Routinely follows with PCP for stroke risk factor management.  Neck scheduled follow-up visit in August 2026 but plans on scheduling sooner follow-up visit.  Follow-up visit with cardiology next month.  Reports complete tobacco cessation since discharge.      Epworth sleepiness score:   X /24. FFS at  X / 63 points   BMI: 22.3 kg/m  Neck Circumference: not measured.     Sleep Summary:   Start of  Recording Time (hours, min):   02-26-2024. 21: 56 hours     Total Sleep Time (hours, min):  5 h 29 m   Sleep efficiency in % : 79%  REM sleep captured : over 1  hour                                    Respiratory Indices ( AHI ) by AASM  criteria of scoring / Calculated pAHI (per hour):  13.8/h                                                Positional  respiratory activity  / snoring : supine and prone sleep were associated with high AHI ( 16.8/h and 18.3/h)  Lateral sleep was associated with low AHI < 5/h.  REM sleep in supine was showing the highest number of apneas and hypopneas, and snoring activity as well.   Oxygen Saturation during Sleep:   Oxygen  Saturation ( in %) : Mean at 95.5 % with an O2 Saturation Range (%) between a nadir of 73% and max. saturation of 100%.                                   O2 Saturation time (minutes) <89%: 0 minutes          Pulse Rate during Sleep :   Pulse Mean (bpm) was 69 bpm in regular rhythm with isolated PVCs. Pulse Range was between 53  bpm and 92 bpm.,                 IMPRESSION:  This HST confirms the presence of mild sleep apnea which was exacerbated by supine and prone sleep position. No sleep hypoxia was seen.   REM sleep was associated with clusters of apneas and hypopneas as seen in obstructive sleep apnea, but usually associated  with high BMI individuals. These clusters were only present while in supine sleep position.       RECOMMENDATION: Ms. Langham should avoid the prone and supine sleep positions and change her sleep- position to lateral ( left/ right) or sleep with head of bed elevated ( upright) .   According to the data of the HST, the patient did not suffer apneas or hypopneas when not in REM sleep while also in supine or prone sleep position.   Depending on the patient's ability to sleep not  in the above named positions (and rather upright)  may recommend additional positive airway pressure therapy ( CPAP ) as well:  Note to Stroke MD/ NP McCue : If your patient cannot avoid supine sleep, please start on auto CPAP ResMed 5-15 cm water pressure 1 cm EPR and heated humidity, mask to be fitted.    PS : Weight loss is not needed in a person with this BMI ( 22.3) . I noted that smoking cessation has taken place, eliminating another OSA risk factor.  Any Patient endorsing a high level of sleepiness should be cautioned not to drive, work at heights, or operate dangerous machinery or heavy equipment when tired or sleepy.  Review of good sleep hygiene measures took place in the initial consultation but should be revisited ( Your guide to better sleep  a publication by the NIH is a  good source of information).   The referring provider will be notified of the test results.    I certify that I have reviewed the raw data recording prior to  the issuance of this report in accordance with the standards of the American Academy of Sleep Medicine (AASM).    INTERPRETING PHYSICIAN: Dedra Gores, MD  [facility] Sleep Board certified by The [employer] of Sleep Medicine and Diplomate of the [employer] of Sleep Medicine. Board certified In Neurology through the ABPN, Fellow of the [employer].               "

## 2024-03-11 ENCOUNTER — Ambulatory Visit: Payer: Self-pay | Admitting: Adult Health

## 2024-03-11 ENCOUNTER — Telehealth: Payer: Self-pay | Admitting: Neurology

## 2024-03-11 NOTE — Telephone Encounter (Signed)
 MYC conf

## 2024-03-12 MED ORDER — METOPROLOL SUCCINATE ER 25 MG PO TB24: 25.0000 mg | ORAL_TABLET | Freq: Every day | ORAL | 3 refills | Status: AC

## 2024-03-12 NOTE — Telephone Encounter (Addendum)
 Called patient in regards to question about interactions between Toprol -XL 25 mg and Methimazole 5 mg, patient verbalized understanding and also wanted to know how long she would have to take medication, suggested she discuss with provider at her appointment on 1/15, patient agreed, sent prescription to pharmacy    ----- Message from Thom LITTIE Sluder sent at 03/12/2024 12:52 PM EST ----- No interaction. Can continue both. May even help.  ----- Message ----- From: Devora Avery LABOR, CMA Sent: 03/08/2024   4:49 PM EST To: Thom LITTIE Sluder, PA-C  Spoke to pt about results and pt verbalized concerns about interaction between the following medications Toprol - XL 25 mg and methimazole (TAPAZOLE) 5 MG. Please advise  ----- Message ----- From: Sluder Thom LITTIE, PA-C Sent: 03/06/2024   5:12 PM EST To: [facility] Results  No evidence of any atrial fibrillation to contribute to cryptogenic stroke.  2% PVC burden which are extra beats in which she was symptomatic 2.  These are benign and normal variants of cardiac  physiology.  If symptomatic could consider  beta-blocker therapy if she is interested.  If she says yes would just start Toprol -XL 25 mg daily.  Also this is in the setting of abnormal thyroid function so treating the underlying cause of this likely to improve the PVCs as well  Did she follow-up with endocrinology yet?

## 2024-03-13 ENCOUNTER — Encounter: Payer: Self-pay | Admitting: Neurology

## 2024-03-13 ENCOUNTER — Ambulatory Visit: Admitting: Neurology

## 2024-03-13 VITALS — BP 130/82

## 2024-03-13 DIAGNOSIS — R269 Unspecified abnormalities of gait and mobility: Secondary | ICD-10-CM | POA: Diagnosis not present

## 2024-03-13 DIAGNOSIS — I639 Cerebral infarction, unspecified: Secondary | ICD-10-CM | POA: Diagnosis not present

## 2024-03-13 DIAGNOSIS — R200 Anesthesia of skin: Secondary | ICD-10-CM

## 2024-03-13 DIAGNOSIS — G5603 Carpal tunnel syndrome, bilateral upper limbs: Secondary | ICD-10-CM | POA: Diagnosis not present

## 2024-03-13 MED ORDER — DULOXETINE HCL 30 MG PO CPEP: 30.0000 mg | ORAL_CAPSULE | Freq: Every day | ORAL | 6 refills | Status: AC

## 2024-03-13 NOTE — Progress Notes (Signed)
 "  Chief Complaint  Patient presents with   Follow-up    Pt in EMG room 3. Alone. Here for NCS.      ASSESSMENT AND PLAN  Zoe Gordon is a 54 y.o. female   Subacute onset of right foot weakness gait abnormality in October,  MRI brain from outside hospital showed left frontal lesion, was diagnosed with cryptogenic stroke,  New onset bilateral foot numbness, left worse than right, increased gait abnormality  Hyperreflexia on examination bilateral Babinski signs,  MRI of cervical with without contrast to rule out cervical pathology, advised patient bring MRI of the brain at her next follow-up for Dr. Rosemarie to review   DIAGNOSTIC DATA (LABS, IMAGING, TESTING) - I reviewed patient records, labs, notes, testing and imaging myself where available.   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  History is obtained from the patient and review of electronic medical records. I personally reviewed pertinent available imaging films in PACS.   PMHx of  Depression anxiety Smoker  She was admitted to the hospital on December 25, 2023 for a week history of slowly worsening right leg weakness, I did not find MRI report, that was done at [facility], CT for periventricular hypodensity, focal hypodensity at the left high frontal cortex  CT angiogram head and neck showed no large vessel disease  Lab, normal B12, CBC, CMP, A1c 5.3, LDL 167  She was diagnosed with cryptogenic stroke with vascular risk factor of smoking, with recent diagnosis of hyperthyroidism, hyperlipidemia, hypertension, was discharged with dual antiplatelet agent, normal aspirin  81 mg alone  TEE showed no significant abnormality, agitated saline bubble study was negative  She initially recovered well, about end of December, she noticed top of her left foot  numbness, extending to her left leg, increased bilateral lower extremity weakness, with some involvement of the right leg, she also complains of increased to fatigue, lack of stamina, 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  She do have the habit of crossing her legs in the seated position, also suffered severe left hip injury due to fall in 2022 required left partial hip replacement,, now has worsening left hip pain, on schedule for left complete redo.   PHYSICAL EXAM:   Vitals:   03/13/24 0811  BP: 130/82     PHYSICAL EXAMNIATION:  Gen: NAD, conversant, well nourised, well groomed                     Cardiovascular: Regular rate rhythm, no peripheral edema, warm, nontender. Eyes: Conjunctivae clear without exudates or hemorrhage Neck: Supple, no carotid bruits. Pulmonary: Clear to auscultation bilaterally   NEUROLOGICAL EXAM:  MENTAL STATUS: Speech/cognition: Anxious looking middle-age female, awake, alert, oriented to history taking and casual conversation CRANIAL NERVES: CN II: Visual fields are full to confrontation. Pupils are round equal and briskly reactive to light. CN III, IV, VI: extraocular movement are normal. No ptosis. CN V: Facial sensation is intact to light touch CN VII: Face is symmetric with normal eye closure  CN VIII: Hearing is normal to causal conversation. CN IX,  X: Phonation is normal. CN XI: Head turning and shoulder shrug are intact  MOTOR: There is no pronator drift of out-stretched arms. Muscle bulk and tone are normal. Muscle strength is normal.  REFLEXES: Reflexes are 2+ and symmetric at the biceps, triceps, more noticeable at right knee, but also  have hyperreflexia on the left side, and brisk reflex of bilateral ankles. Plantar responses are extensor bilaterally  SENSORY: Mildly decreased light touch at top of left foot, extending to left lateral leg, well-preserved bilateral total vibratory sensation  COORDINATION: There is no trunk or limb dysmetria noted.  GAIT/STANCE: Stiff, dragging right leg some  REVIEW OF SYSTEMS:  Full 14 system review of systems performed and notable only for as above All other review of systems were negative.   ALLERGIES: Allergies[1]  HOME MEDICATIONS: Current Outpatient Medications  Medication Sig Dispense Refill   aspirin  EC 81 MG tablet Take 1 tablet (81 mg total) by mouth daily. Swallow whole. 90 tablet 0   clonazePAM  (KLONOPIN ) 1 MG tablet Take 1 mg by mouth daily as needed for anxiety.     DULoxetine  (CYMBALTA ) 30 MG capsule Take 1 capsule (30 mg total) by mouth daily. 30 capsule 6   ezetimibe  (ZETIA ) 10 MG tablet Take 1 tablet (10 mg total) by mouth daily. 90 tablet 0   fluticasone  (FLONASE ) 50 MCG/ACT nasal spray Place 2 sprays into both nostrils daily. (Patient taking differently: Place 2 sprays into both nostrils daily as needed for allergies or rhinitis.)     losartan  (COZAAR ) 50 MG tablet Take 50 mg by mouth daily. Take 25 mg by mouth in the morning and 50 mg in the evening     methimazole (TAPAZOLE) 5 MG tablet Take one tab po daily     rosuvastatin  (CRESTOR ) 10 MG tablet Take 1 tablet (10 mg total) by mouth daily. 30 tablet 5   metoprolol  succinate (TOPROL -XL) 25 MG 24 hr tablet Take 1 tablet (25 mg total) by mouth daily. (Patient not taking: Reported on 03/13/2024) 90 tablet 3   No current facility-administered medications for this visit.    PAST MEDICAL HISTORY: Past Medical History:  Diagnosis Date   Anxiety    Arthritis    Cancer (HCC)    basal cell lesion on abdomen   Complication of anesthesia    hard to wake up, very jittery   Depression    Hypertension     PAST  SURGICAL HISTORY: Past Surgical History:  Procedure Laterality Date   BREAST SURGERY Bilateral 2000   breast implants, augmentation  done at [facility]   HEMIARTHROPLASTY HIP Left 2021   done at [facility]     bone graft to prepare for implant   TONSILLECTOMY     TRANSESOPHAGEAL ECHOCARDIOGRAM (CATH LAB) N/A 12/27/2023   Procedure: TRANSESOPHAGEAL ECHOCARDIOGRAM;  Surgeon: Francyne Headland, MD;  Location: [facility] INVASIVE CV LAB;  Service: Cardiovascular;  Laterality: N/A;   WISDOM TOOTH EXTRACTION      FAMILY HISTORY: Family History  Problem Relation Age of Onset   Breast cancer Sister 11       once and 9 & again at 33    SOCIAL HISTORY: Social History   Socioeconomic History   Marital status: Single    Spouse name: Not on file   Number of children: Not on file   Years of education: Not on file   Highest education level: Not on file  Occupational History   Not on file  Tobacco Use  Smoking status: Former    Average packs/day: 0.5 packs/day for 34.7 years (17.4 ttl pk-yrs)    Types: Cigarettes    Start date: 64   Smokeless tobacco: Never  Vaping Use   Vaping status: Never Used  Substance and Sexual Activity   Alcohol use: Yes    Comment: social   Drug use: Not Currently   Sexual activity: Yes    Birth control/protection: Post-menopausal  Other Topics Concern   Not on file  Social History Narrative   Not on file   Social Drivers of Health   Tobacco Use: High Risk (02/14/2024)   Received from [facility]   Patient History    Smoking Tobacco Use: Every Day    Smokeless Tobacco Use: Never    Passive Exposure: Not on file  Financial Resource Strain: Not on file  Food Insecurity: Low Risk (01/17/2024)   Received from [facility]   Epic    Within the past 12 months, you worried that your food would run out before you got money to buy more: Never true    Within the past 12 months, the food you bought just didn't last and you didn't have  money to get more. : Never true  Transportation Needs: No Transportation Needs (01/17/2024)   Received from [facility]    In the past 12 months, has lack of reliable transportation kept you from medical appointments, meetings, work or from getting things needed for daily living? : No  Physical Activity: Not on file  Stress: Not on file  Social Connections: Moderately Isolated (12/26/2023)   Social Connection and Isolation Panel    Frequency of Communication with Friends and Family: Once a week    Frequency of Social Gatherings with Friends and Family: Once a week    Attends Religious Services: 1 to 4 times per year    Active Member of [employer] or Organizations: Yes    Attends Banker Meetings: 1 to 4 times per year    Marital Status: Divorced  Intimate Partner Violence: Not At Risk (12/26/2023)   Epic    Fear of Current or Ex-Partner: No    Emotionally Abused: No    Physically Abused: No    Sexually Abused: No  Depression (PHQ2-9): Not on file  Alcohol Screen: Not on file  Housing: Low Risk (01/17/2024)   Received from [facility]   Epic    What is your living situation today?: I have a steady place to live    Think about the place you live. Do you have problems with any of the following? Choose all that apply:: None/None on this list  Utilities: Low Risk (01/17/2024)   Received from [facility]   Utilities    In the past 12 months has the electric, gas, oil, or water company threatened to shut off services in your home? : No  Health Literacy: Not on file      Modena Callander, M.D. Ph.D.  [facility] [address], [address] [place], [place] [address] Ph: [phone] Fax: [phone]  CC:  Benson Eleanor Rung, NP [address] [place],  [place] [address]  Brown-Patram, Melissa Joyce, NP       [1]  Allergies Allergen Reactions   Sulfamethoxazole Itching   Codeine Itching   Shellfish Allergy Swelling    Eyes  swell shut   Strawberry (Diagnostic) Hives and Itching   "

## 2024-03-13 NOTE — Procedures (Signed)
 "         Full Name: Zoe Gordon Gender: Female MRN #: [id number] Date of Birth: 1970-12-30    Visit Date: 03/13/2024 09:01 Age: 54 Years Examining Physician: Onita Duos Referring Physician: Harlene Height: 5 feet 6 inch History: 54 year old female with 3 weeks history of left foot numbness, intermittent bilateral hands numbness,  Summary of the test: Nerve conduction study: Left superficial peroneal sensory responses were absent.  The right side was present, within normal limit. Left peroneal to EDB motor response was absent.  Right peroneal to EDB motor responses were within normal limit.  Bilateral sural sensory responses were normal.  Bilateral tibial motor responses were normal.  Bilateral ulnar sensory and motor responses were normal.  Right radial sensory response was normal.  Right median sensory response was absent.  Left median sensory response showed mildly prolonged peak latency with mildly decreased snap amplitude.  Left median motor response showed mildly prolonged distal latency.  Right median motor response was within normal limit.  Electromyography: Selected needle examination were performed at bilateral lower extremity muscles, lumbosacral paraspinal muscles; left upper extremity muscles and left cervical paraspinal muscles.  Left abductor pollicis brevis showed normal insertional activity, no spontaneous activity, normal morphology motor unit potentials mildly decreased recruitment patterns.  Rest of the needle examination showed no significant abnormalities.  Conclusion:  This is an abnormal study.  There is electrodiagnostic evidence of bilateral distal median neuropathy across the wrist, consistent with bilateral carpal tunnel syndromes;  left side is moderate,; right side is mild.  In addition, there is evidence of left superficial peroneal neuropathy.   There was no evidence of large fiber peripheral neuropathy, or left lumbar radiculopathy.   Duos Onita. M.D. Ph.D.   [facility] [address] [place], [place] [address] Tel: [phone] Fax: [phone]  Verbal informed consent was obtained from the patient, patient was informed of potential risk of procedure, including bruising, bleeding, hematoma formation, infection, muscle weakness, muscle pain, numbness, among others.        MNC    Nerve / Sites Muscle Latency Ref. Amplitude Ref. Rel Amp Segments Distance Velocity Ref. Area    ms ms mV mV %  cm m/s m/s mVms  L Median - APB     Wrist APB 4.6 <=4.4 6.3 >=4.0 100 Wrist - APB 7   23.7     Upper arm APB 8.7  6.1  97.2 Upper arm - Wrist 21 52 >=49 22.7  R Median - APB     Wrist APB 4.2 <=4.4 5.9 >=4.0 100 Wrist - APB 7   22.4     Upper arm APB 8.2  6.0  101 Upper arm - Wrist 20 50 >=49 23.2  L Ulnar - ADM     Wrist ADM 2.8 <=3.3 9.8 >=6.0 100 Wrist - ADM 7   30.2     B.Elbow ADM 4.5  9.5  97.7 B.Elbow - Wrist 11 67 >=49 28.3     A.Elbow ADM 6.1  8.4  88.1 A.Elbow - B.Elbow 11 66 >=49 26.5  R Ulnar - ADM     Wrist ADM 2.7 <=3.3 9.5 >=6.0 100 Wrist - ADM 712   31.8     B.Elbow ADM 5.3  10.4  110 B.Elbow - Wrist 15 60 >=49 33.9     A.Elbow ADM 7.4  10.3  99 A.Elbow - B.Elbow 12 56 >=49 34.6  L Peroneal - EDB     Ankle EDB NR <=6.5  NR >=2.0 NR Ankle - EDB 9   NR         Pop fossa - Ankle      R Peroneal - EDB     Ankle EDB 5.4 <=6.5 4.3 >=2.0 100 Ankle - EDB 9   16.5     Fib head EDB 11.3  4.2  95.9 Fib head - Ankle 28 48 >=44 15.7     Pop fossa EDB 14.1  4.1  99.6 Pop fossa - Fib head 12 43 >=44 15.4         Pop fossa - Ankle      L Tibial - AH     Ankle AH 4.8 <=5.8 18.0 >=4.0 100 Ankle - AH 9   38.5     Pop fossa AH 13.1  14.2  78.9 Pop fossa - Ankle 40 48 >=41 37.9  R Tibial - AH     Ankle AH 3.9 <=5.8 13.1 >=4.0 100 Ankle - AH 9   24.5     Pop fossa AH 13.2  11.3  85.8 Pop fossa - Ankle 43 46 >=41 23.0                     SNC    Nerve / Sites Rec. Site Peak Lat Ref.  Amp Ref. Segments  Distance Ref.    ms ms V V  cm ms  R Radial - Anatomical snuff box (Forearm)     Forearm Wrist 2.2 <=2.9 41 >=15 Forearm - Wrist 10   L Sural - Ankle (Calf)     Calf Ankle 3.9 <=4.4 19 >=6 Calf - Ankle 14   R Sural - Ankle (Calf)     Calf Ankle 3.9 <=4.4 10 >=6 Calf - Ankle 14   L Superficial peroneal - Ankle     Lat leg Ankle NR <=4.4 NR >=6 Lat leg - Ankle 14   R Superficial peroneal - Ankle     Lat leg Ankle 3.8 <=4.4 7 >=6 Lat leg - Ankle 14   R Median - Orthodromic (Dig II, Mid palm)     Dig II Wrist NR <=3.4 NR >=10 Dig II - Wrist 13   L Median - Orthodromic (Dig II, Mid palm)     Dig II Wrist 4.4 <=3.4 9 >=10 Dig II - Wrist 13   R Ulnar - Orthodromic, (Dig V, Mid palm)     Dig V Wrist 2.7 <=3.1 12 >=5 Dig V - Wrist 11   L Ulnar - Orthodromic, (Dig V, Mid palm)     Dig V Wrist 2.4 <=3.1 7 >=5 Dig V - Wrist 54                          F  Wave    Nerve F Lat Ref.   ms ms  L Tibial - AH 51.4 <=56.0  R Tibial - AH 55.4 <=56.0  L Ulnar - ADM 26.0 <=32.0  R Ulnar - ADM 27.1 <=32.0             EMG Summary Table    Spontaneous MUAP Recruitment  Muscle IA Fib PSW Fasc Other Amp Dur. Poly Pattern  L. Tibialis anterior Normal None None None _______ Normal Normal Normal Normal  L. Peroneus longus Normal None None None _______ Normal Normal Normal Normal  L. Tibialis posterior Normal None None None _______ Normal Normal Normal Normal  L. Gastrocnemius (Medial head) Normal None  None None _______ Normal Normal Normal Normal  L. Vastus lateralis Normal None None None _______ Normal Normal Normal Normal  R. Tibialis anterior Normal None None None _______ Normal Normal Normal Normal  R. Tibialis posterior Normal None None None _______ Normal Normal Normal Normal  R. Gastrocnemius (Medial head) Normal None None None _______ Normal Normal Normal Normal  R. Vastus lateralis Normal None None None _______ Normal Normal Normal Normal  R. Peroneus longus Normal None None None _______ Normal  Normal Normal Normal  R. Lumbar paraspinals (mid) Normal None None None _______ Normal Normal Normal Normal  R. Lumbar paraspinals (low) Normal None None None _______ Normal Normal Normal Normal  L. Lumbar paraspinals (mid) Normal None None None _______ Normal Normal Normal Normal  L. Lumbar paraspinals (low) Normal None None None _______ Normal Normal Normal Normal  L. First dorsal interosseous Normal None None None _______ Normal Normal Normal Normal  L. Pronator teres Normal None None None _______ Normal Normal Normal Normal  L. Triceps brachii Normal None None None _______ Normal Normal Normal Normal  L. Biceps brachii Normal None None None _______ Normal Normal Normal Normal  L. Extensor digitorum communis Normal None None None _______ Normal Normal Normal Normal  L. Cervical paraspinals Normal None None None _______ Normal Normal Normal Normal     "

## 2024-03-14 NOTE — Telephone Encounter (Signed)
 Will you please let me know if she needs to schedule this or if I can cancel the order? thanks

## 2024-03-20 NOTE — Progress Notes (Signed)
 " Cardiology Office Note:  .   Date:  03/21/2024  ID:  Zoe Gordon, DOB Nov 28, 1970, MRN [id number] PCP: Benson Eleanor Rung, NP  [facility] Providers Cardiologist:  Jerel Balding, MD {  History of Present Illness: .   Zoe Gordon is a 54 y.o. female with history of ischemic CVA 12/2023, hypertension, hyperlipidemia, previous tobacco use.      Cryptogenic stroke 12/2023 admission for cryptogenic stroke.  Primary complaint lower extremity weakness.  No cardiac source.  Echocardiogram/TEE bubble both normal.  CT angiogram with no large vessel disease. Of note: TTE had noted mild to moderate mitral regurgitation however TEE demonstrated trivial disease.  02/2024 cardiac event monitor with no evidence of atrial fibrillation.  PVCs 2%.  Triggered events related to sinus rhythm with PVCs.  Social history  History of smoking of 1 pack/day for the last 20 to 25 years but has quit after stroke.  Denied any drugs or alcohol.  Works as a interior and spatial designer.     Patient with history of cryptogenic stroke although no cardiac source has been found with normal echocardiogram/TEE/event monitor with no evidence of atrial fibrillation or thrombus.  I saw her 01/2024 and she was reporting severe systemic fatigue but with abnormal thyroid function test consistent with hyperthyroidism and abnormal iron panel with plans to see endocrinology.  Reported significant interference with ADLs.  She had concerns of DVT, no evidence of thrombus on Dopplers.  She saw endocrinology who thought symptoms were disproportionate to her degree of hyperthyroidism.  Lower extremity weakness/neuropathy has been her main complaint.  She just saw neurology 1/7 who ordered MRI of the cervical spine to rule out cervical pathology.  Today patient presents for follow-up.  She is not doing very well and still feels that she is not improving.  Continues to report lower extremity weakness, severe fatigue.  She has been seen  by podiatry who has ordered autoimmune testing without any significant findings.  They are doing nerve conduction studies.  She reports some pain in the lower extremities worse on the left side but has prior history of hip injury.  Reports left-sided groin pain both with exertion and at rest.  Resting does not improve pain.  Reports palpitations in the setting of stress.  Reports severe anxiety about everything going on with her medically.  She is off statin therapy and felt that the atorvastatin  and rosuvastatin  made her leg weakness worse.  But still reports persistent symptoms in her lower extremities.  ROS: Denies: Chest pain, shortness of breath, orthopnea, peripheral edema, palpitations, syncope, dizziness.   Studies Reviewed: .         Risk Assessment/Calculations:             Physical Exam:   VS:  BP 114/68   Pulse 91   Ht 5' 6 (1.676 m)   Wt 133 lb 6.4 oz (60.5 kg)   LMP  (LMP Unknown)   SpO2 98%   BMI 21.53 kg/m    Wt Readings from Last 3 Encounters:  03/21/24 133 lb 6.4 oz (60.5 kg)  02/05/24 138 lb (62.6 kg)  01/24/24 136 lb 12.8 oz (62.1 kg)    GEN: Well nourished, well developed in no acute distress NECK: No JVD; No carotid bruits CARDIAC: RRR, no murmurs, rubs, gallops RESPIRATORY:  Clear to auscultation without rales, wheezing or rhonchi  ABDOMEN: Soft, non-tender, non-distended EXTREMITIES:  L leg greater size than right  ASSESSMENT AND PLAN: .    Cryptogenic stroke Lower extremity weakness  Peripheral neuropathy with B12/B6/iron deficiency Systemic fatigue No cardiac source based off of TEE/TTE and heart monitor with no evidence of atrial fibrillation.  CTA with no large vessel occlusion.  Etiology for her lower extremity weakness/neuropathy is also not clear. Unclear if this is related to her stroke and confounded by other secondary causes including B12 deficiency, thyroid dysfunction.  Seen by podiatry without any significant findings on autoimmune testing.   They had done nerve conduction studies.  Her symptoms not entirely consistent with PAD although possible. Will order lower extremity arterial Dopplers and ABIs. Low suspicion but could consider obtaining coronary CTA to rule out obstructive CAD contributing to her symptoms. Continue aspirin  Neurology has plans for cervical MRI  PVCs Very mildly aware and often triggered by stress.  She does not want to be on multiple medications so therefore has not started the Toprol  XL 25 mg.  PVC burden is around 2% which is not significant, up to her if she wants to continue this or not.  Hyperthyroidism Follows with endocrinology.  Primary symptom is severe fatigue although disproportionate to her degree of hyperthyroidism per the last note.  On methimazole.  Hyperlipidemia Has not tolerated atorvastatin  and rosuvastatin  secondary to lower extremity weakness although since this has been stopped still has persistent symptoms but possibly worse while on statin medications. LDL should be less than 70.  12/2023 LDL 167.  Referred to lipid clinic to consider PCSK9 inhibitor. Continue Zetia   Hypertension Good control 114/68 today.  Continue with losartan  50 mg daily.    Dispo: Follow-up after lower extremity ultrasounds.  Signed, Thom LITTIE Sluder, PA-C  "

## 2024-03-21 ENCOUNTER — Encounter: Payer: Self-pay | Admitting: Cardiology

## 2024-03-21 ENCOUNTER — Ambulatory Visit: Payer: Self-pay | Attending: Cardiology | Admitting: Cardiology

## 2024-03-21 VITALS — BP 114/68 | HR 91 | Ht 66.0 in | Wt 133.4 lb

## 2024-03-21 DIAGNOSIS — E782 Mixed hyperlipidemia: Secondary | ICD-10-CM | POA: Diagnosis not present

## 2024-03-21 DIAGNOSIS — I739 Peripheral vascular disease, unspecified: Secondary | ICD-10-CM

## 2024-03-21 NOTE — Patient Instructions (Signed)
 Medication Instructions:  None  *If you need a refill on your cardiac medications before your next appointment, please call your pharmacy*  Lab Work: None  If you have labs (blood work) drawn today and your tests are completely normal, you will receive your results only by: MyChart Message (if you have MyChart) OR A paper copy in the mail If you have any lab test that is abnormal or we need to change your treatment, we will call you to review the results.  Testing/Procedures: Venus Doppler Your physician has requested that you have a lower or upper extremity venous duplex. This test is an ultrasound of the veins in the legs or arms. It looks at venous blood flow that carries blood from the heart to the legs or arms. Allow one hour for a Lower Venous exam. Allow thirty minutes for an Upper Venous exam. There are no restrictions or special instructions.  Please note: We ask at that you not bring children with you during ultrasound (echo/ vascular) testing. Due to room size and safety concerns, children are not allowed in the ultrasound rooms during exams. Our front office staff cannot provide observation of children in our lobby area while testing is being conducted. An adult accompanying a patient to their appointment will only be allowed in the ultrasound room at the discretion of the ultrasound technician under special circumstances. We apologize for any inconvenience.   Follow-Up: At [facility], you and your health needs are our priority.  As part of our continuing mission to provide you with exceptional heart care, our providers are all part of one team.  This team includes your primary Cardiologist (physician) and Advanced Practice Providers or APPs (Physician Assistants and Nurse Practitioners) who all work together to provide you with the care you need, when you need it.  Your next appointment:    After venous doppler.   Provider:   Thom Sluder, PA-C          We recommend  signing up for the patient portal called MyChart.  Sign up information is provided on this After Visit Summary.  MyChart is used to connect with patients for Virtual Visits (Telemedicine).  Patients are able to view lab/test results, encounter notes, upcoming appointments, etc.  Non-urgent messages can be sent to your provider as well.   To learn more about what you can do with MyChart, go to [email or web address].   Other Instructions None

## 2024-03-22 ENCOUNTER — Encounter: Payer: Self-pay | Admitting: Adult Health

## 2024-03-24 NOTE — Progress Notes (Signed)
 W/U for PAD is appropriate. She has very high LDL and had a lot of plaque in the aorta.  Zetia  is not enough. Buts uspect the complaints are not due to cardiac illness.

## 2024-03-27 DIAGNOSIS — I700 Atherosclerosis of aorta: Secondary | ICD-10-CM | POA: Insufficient documentation

## 2024-03-28 NOTE — Telephone Encounter (Signed)
 Is this something that needs to be redone?

## 2024-03-28 NOTE — Telephone Encounter (Signed)
 When I last saw her, I did not think repeat imaging was warranted as lower extremity complaints were not new and were present during hospitalization. If from a stroke, her symptoms should not be gradually worsening like they are. Please advise PCP.

## 2024-03-28 NOTE — Telephone Encounter (Signed)
 Shanda at patients PCP was notified.

## 2024-03-28 NOTE — Telephone Encounter (Signed)
 Shanda from pt's PCP called stating that the pt was seen yesterday and is still having lower leg weakness and the provider was wondering if a Repeat Brain MRI should be done. Please call Shanda back at [phone]

## 2024-03-29 ENCOUNTER — Ambulatory Visit (HOSPITAL_COMMUNITY)
Admission: RE | Admit: 2024-03-29 | Discharge: 2024-03-29 | Disposition: A | Source: Ambulatory Visit | Attending: Cardiology

## 2024-03-29 DIAGNOSIS — I739 Peripheral vascular disease, unspecified: Secondary | ICD-10-CM | POA: Diagnosis present

## 2024-03-30 LAB — VAS US ABI WITH/WO TBI
Left ABI: 0.45
Right ABI: 0.46

## 2024-04-01 ENCOUNTER — Ambulatory Visit: Payer: Self-pay | Admitting: Cardiology

## 2024-04-01 DIAGNOSIS — E782 Mixed hyperlipidemia: Secondary | ICD-10-CM

## 2024-04-01 DIAGNOSIS — Z789 Other specified health status: Secondary | ICD-10-CM

## 2024-04-01 DIAGNOSIS — I739 Peripheral vascular disease, unspecified: Secondary | ICD-10-CM

## 2024-04-02 NOTE — Addendum Note (Signed)
 Addended by: Caleesi Kohl on: 04/02/2024 09:26 AM   Modules accepted: Orders

## 2024-04-02 NOTE — Addendum Note (Signed)
 Addended by: Madisynn Plair on: 04/02/2024 02:52 PM   Modules accepted: Orders

## 2024-04-02 NOTE — Addendum Note (Signed)
 Addended by: Jashley Yellin on: 04/02/2024 03:09 PM   Modules accepted: Orders

## 2024-04-03 ENCOUNTER — Encounter (HOSPITAL_BASED_OUTPATIENT_CLINIC_OR_DEPARTMENT_OTHER): Payer: Self-pay | Admitting: Emergency Medicine

## 2024-04-03 ENCOUNTER — Emergency Department (HOSPITAL_BASED_OUTPATIENT_CLINIC_OR_DEPARTMENT_OTHER)
Admission: EM | Admit: 2024-04-03 | Discharge: 2024-04-03 | Disposition: A | Discharge status: Home / Self Care | Attending: Emergency Medicine | Admitting: Emergency Medicine

## 2024-04-03 ENCOUNTER — Telehealth: Payer: Self-pay | Admitting: Cardiovascular Disease

## 2024-04-03 ENCOUNTER — Other Ambulatory Visit: Payer: Self-pay

## 2024-04-03 DIAGNOSIS — F17200 Nicotine dependence, unspecified, uncomplicated: Secondary | ICD-10-CM | POA: Insufficient documentation

## 2024-04-03 DIAGNOSIS — Z8673 Personal history of transient ischemic attack (TIA), and cerebral infarction without residual deficits: Secondary | ICD-10-CM | POA: Diagnosis not present

## 2024-04-03 DIAGNOSIS — I739 Peripheral vascular disease, unspecified: Secondary | ICD-10-CM | POA: Diagnosis not present

## 2024-04-03 DIAGNOSIS — R200 Anesthesia of skin: Secondary | ICD-10-CM | POA: Diagnosis present

## 2024-04-03 DIAGNOSIS — Z7982 Long term (current) use of aspirin: Secondary | ICD-10-CM | POA: Insufficient documentation

## 2024-04-03 DIAGNOSIS — I10 Essential (primary) hypertension: Secondary | ICD-10-CM | POA: Diagnosis not present

## 2024-04-03 DIAGNOSIS — Z79899 Other long term (current) drug therapy: Secondary | ICD-10-CM | POA: Insufficient documentation

## 2024-04-03 HISTORY — DX: Peripheral vascular disease, unspecified: I73.9

## 2024-04-03 LAB — BASIC METABOLIC PANEL WITH GFR
Anion gap: 12 (ref 5–15)
BUN: 14 mg/dL (ref 6–20)
CO2: 25 mmol/L (ref 22–32)
Calcium: 10.4 mg/dL — ABNORMAL HIGH (ref 8.9–10.3)
Chloride: 102 mmol/L (ref 98–111)
Creatinine, Ser: 0.76 mg/dL (ref 0.44–1.00)
GFR, Estimated: >60 mL/min
Glucose, Bld: 134 mg/dL — ABNORMAL HIGH (ref 70–99)
Potassium: 4 mmol/L (ref 3.5–5.1)
Sodium: 139 mmol/L (ref 135–145)

## 2024-04-03 LAB — CBC
HCT: 40.1 % (ref 36.0–46.0)
Hemoglobin: 13.5 g/dL (ref 12.0–15.0)
MCH: 30.6 pg (ref 26.0–34.0)
MCHC: 33.7 g/dL (ref 30.0–36.0)
MCV: 90.9 fL (ref 80.0–100.0)
Platelets: 250 10*3/uL (ref 150–400)
RBC: 4.41 MIL/uL (ref 3.87–5.11)
RDW: 14.6 % (ref 11.5–15.5)
WBC: 7.5 10*3/uL (ref 4.0–10.5)
nRBC: 0 % (ref 0.0–0.2)

## 2024-04-03 LAB — TROPONIN T, HIGH SENSITIVITY: Troponin T High Sensitivity: 12 ng/L (ref 0–19)

## 2024-04-03 NOTE — Telephone Encounter (Signed)
 Patient was seen on 03/21/24 and would like a call back to answer questions about referral to vascular. Please advise

## 2024-04-03 NOTE — Telephone Encounter (Signed)
 Patient called reporting numbness on the top of the right foot radiating downward, along with purple/blue discoloration. Patient states the right lower extremity feels very cold, extending up to just above the knee. Patient was instructed to gently press on the foot to assess capillary refill and reports color return taking longer than 30 seconds.  Patient denies pain in the leg but reports coldness and numbness. She has an upcoming appointment with Vascular on 2/18 with Dr Sheree, but expresses significant concern and fear of limb loss and is requesting to be seen sooner.  Emergency precautions were reviewed, and the patient verbalizes understanding. Will notify Dr. JAYSON and Thom for further advisement.

## 2024-04-03 NOTE — ED Triage Notes (Signed)
 Pt via pov from home with blue toes (resolved); states she has recently been diagnosed with PAD and her cardiology office called her for a different reason and told her to come to ED for ultrasound. Pt states her left foot (2 toes) was worse, but some blue in her right toes. Pt a&o x4; nad noted.

## 2024-04-03 NOTE — Telephone Encounter (Signed)
 Spoke with Dr Francyne and he asvised pt be seen in the ED. Instructed patient of this over the phone. Pt states she will try to go as she is not sure how bad the roads are.

## 2024-04-03 NOTE — Discharge Instructions (Signed)
 Emergency Department Discharge Instructions Reason for Visit  You were seen in the Emergency Department for temporary color change and numbness in your toes, in the setting of known severe peripheral arterial disease (PAD).  At the time of discharge, your symptoms had resolved, and there were no signs of an emergency blood flow problem to the leg.  What We Found Your symptoms were temporary and painless, and resolved on their own. Your exam today did not show signs of acute limb ischemia (a sudden blockage). Your recent vascular studies show severe chronic PAD, which can cause: Cold feet Color changes Fatigue or discomfort with walking Delayed capillary refill Temperature changes (such as a hot shower) can sometimes temporarily worsen circulation in PAD.  What to Do Next Keep your upcoming vascular surgery appointment and request the earliest available visit, as already planned.  Continue taking your prescribed medications, including:  Aspirin   Cholesterol-lowering medications  Blood pressure medications  Avoid prolonged exposure to extreme temperatures (very hot or very cold).  Avoid smoking, as this can significantly worsen PAD.  Walk as tolerated, stopping if you develop pain or significant fatigue.  Return to the Emergency Department IMMEDIATELY if you develop any of the following:  These could be signs of a serious circulation problem:  Severe or persistent leg or foot pain  Foot or toes turning white, gray, or black  Color change that does NOT improve  New weakness or inability to move the foot or toes  Loss of sensation beyond your usual numbness  Open sores, wounds, or ulcers on the feet  Increasing coldness of the leg that does not improve  Do not wait if these occur.  Follow-Up  Vascular Surgery: As scheduled (request sooner if possible)  Primary Care / Cardiology: For ongoing risk-factor management  Neurology: As previously arranged, if symptoms  persist or worsen  Final Notes  Your condition requires close follow-up, but todays episode does not show signs of limb-threatening ischemia. Staying engaged with vascular care and risk-factor control is the most important next step.  If you have questions or feel unsure about new symptoms, it is always okay to return to the Emergency Department.

## 2024-04-03 NOTE — ED Provider Notes (Signed)
 " [place] [facility] AT [facility] Provider Note   CSN: [id number] Arrival date & time: 04/03/24  1345     Patient presents with: Circulatory Problem   Zoe Gordon is a 54 y.o. female with a history of severe peripheral arterial disease, prior ischemic stroke (12/2023) with residual fatigue, hypertension, hyperlipidemia, smoking history, and peripheral neuropathy, who presents with concern for right lower extremity color change and numbness.  Earlier today, shortly after a hot shower, the patient noticed blue-purple discoloration of several toes, more pronounced on the left, associated with coldness and baseline numbness, but no pain. She reports delayed capillary refill at home (>30 seconds). Symptoms resolved spontaneously within 20-30 minutes prior to arrival. She denies rest pain, persistent discoloration, open wounds, ulcers, trauma, or new weakness. She endorses chronic exertional hip and thigh fatigue consistent with claudication, unchanged from baseline.  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 No chest pain, dyspnea, fever, or infectious symptoms.   HPI     Prior to Admission medications  Medication Sig Start Date End Date Taking? Authorizing Provider  aspirin  EC 81 MG tablet Take 1 tablet (81 mg total) by mouth daily. Swallow whole. 12/28/23   Briana Elgin LABOR, MD  clonazePAM  (KLONOPIN ) 1 MG tablet Take 1 mg by mouth daily as needed for anxiety. 07/04/16   [provider]  DULoxetine  (CYMBALTA ) 30 MG capsule Take 1 capsule (30 mg total) by mouth daily. 03/13/24   Onita Duos, MD  ezetimibe  (ZETIA ) 10 MG tablet Take 1 tablet (10 mg total) by mouth daily. 12/28/23   Briana Elgin LABOR, MD  fluticasone  (FLONASE ) 50 MCG/ACT nasal spray Place 2 sprays into both nostrils  daily. Patient taking differently: Place 2 sprays into both nostrils daily as needed for allergies or rhinitis.    [provider]  losartan  (COZAAR ) 50 MG tablet Take 50 mg by mouth daily. Take 25 mg by mouth in the morning and 50 mg in the evening Patient taking differently: Take 25 mg by mouth daily. Take 25 mg by mouth in the morning and 25 mg in the evening 12/08/23 12/07/24  [provider]  methimazole (TAPAZOLE) 5 MG tablet Take one tab po daily 02/15/24   [provider]  metoprolol  succinate (TOPROL -XL) 25 MG 24 hr tablet Take 1 tablet (25 mg total) by mouth daily. 03/12/24   Darryle Thom CROME, PA-C    Allergies: Sulfamethoxazole, Atorvastatin , Codeine, Shellfish allergy, and Strawberry (diagnostic)    Review of Systems Constitutional: Denies fever or chills Cardiovascular: Denies chest pain; reports claudication Respiratory: Denies shortness of breath Neurologic: Reports chronic distal numbness; denies new weakness, speech changes, or vision loss Skin: Reports transient toe discoloration, now resolved; denies wounds or ulcers Musculoskeletal: Reports chronic exertional leg fatigue; denies acute joint pain All other systems reviewed and negative. Updated Vital Signs BP 130/61   Pulse 80   Temp 98.2 F (36.8 C)   Resp 16   Ht 5' 6 (1.676 m)   Wt 60.5 kg   LMP  (LMP Unknown)   SpO2 98%   BMI 21.53 kg/m   Physical Exam General: Alert, anxious but in no acute distress Vital Signs: Mild tachycardia, otherwise stable Cardiovascular: Regular rhythm, no murmurs Pulmonary: Clear to auscultation bilaterally Abdomen: Soft, non-tender Extremities: Lower extremities warm proximally but generally cold distally  No cyanosis, pallor, ulcers, or wounds noted at  time of exam Capillary refill mildly delayed in toes bilaterally Dorsalis pedis and posterior tibial pulses diminished bilaterally(hard to fid by palpation, faint doppler)  Neurologic: Strength grossly  intact in bilateral lower extremities; sensation diminished distally consistent with baseline neuropathy Skin: Intact, no ischemic changes  (all labs ordered are listed, but only abnormal results are displayed) Labs Reviewed  BASIC METABOLIC PANEL WITH GFR - Abnormal; Notable for the following components:      Result Value   Glucose, Bld 134 (*)    Calcium  10.4 (*)    All other components within normal limits  CBC  TROPONIN T, HIGH SENSITIVITY  TROPONIN T, HIGH SENSITIVITY    EKG: None  Radiology: No results found.   Procedures   Medications Ordered in the ED - No data to display                                  Medical Decision Making Amount and/or Complexity of Data Reviewed Labs: ordered.  This is a 54 year old female with known severe chronic PAD presenting with transient, painless toe discoloration and coldness following a hot shower. Given the spontaneous resolution, absence of rest pain, preserved motor function, lack of persistent pallor or cyanosis, and no tissue loss, findings are not consistent with acute limb ischemia.  Laboratory evaluation was reassuring, with no evidence of infection, metabolic derangement, or acute cardiac ischemia. Recent vascular studies demonstrating severe bilateral PAD were reviewed and explain the patients symptoms, which are most consistent with flow-dependent ischemia exacerbated by temperature-related vasodilation.  The patient has vascular surgery follow-up already scheduled, and there is no indication for emergent imaging or hospital admission at this time. The patient was counseled extensively on return precautions and signs of acute limb ischemia.      Final diagnoses:  Peripheral artery disease    ED Discharge Orders     None          Bernadine Manos, MD 04/03/24 8168    Doretha Folks, MD 04/03/24 2302  "

## 2024-04-11 ENCOUNTER — Ambulatory Visit: Admitting: Vascular Surgery

## 2024-04-11 ENCOUNTER — Other Ambulatory Visit: Payer: Self-pay

## 2024-04-11 ENCOUNTER — Encounter: Payer: Self-pay | Admitting: Vascular Surgery

## 2024-04-11 VITALS — BP 108/57 | HR 88 | Temp 98.4°F | Resp 18 | Ht 66.0 in | Wt 131.6 lb

## 2024-04-11 DIAGNOSIS — I70223 Atherosclerosis of native arteries of extremities with rest pain, bilateral legs: Secondary | ICD-10-CM

## 2024-04-11 DIAGNOSIS — Z91013 Allergy to seafood: Secondary | ICD-10-CM

## 2024-04-11 MED ORDER — DIPHENHYDRAMINE HCL 50 MG PO CAPS: ORAL_CAPSULE | ORAL | 0 refills | Status: AC

## 2024-04-11 MED ORDER — PREDNISONE 50 MG PO TABS: ORAL_TABLET | ORAL | 0 refills | Status: AC

## 2024-04-11 NOTE — Progress Notes (Signed)
 " Office Note     CC: Bilateral lower extremity lifestyle-limiting claudication with new onset rest pain Requesting Provider:  Benson Eleanor Rung, NP  HPI: Julita Ozbun is a 54 y.o. (29-Sep-1970) female presenting at the request of .Benson Eleanor Rung, NP for bilateral lower extremity lifestyle-limiting claudication with new onset rest pain.  On exam, Shailey was notably nervous.  A native of [place], she currently lives in [place] and works full-time as a interior and spatial designer.  Medical history includes recent stroke last fall, with right sided deficit which have improved significantly.  Over the last year and a half, she has appreciated lower extremity weakness most notably in the left lower extremity, now in the right lower extremity states that the weakness and achiness limit her ability to ambulate.  States that she has had a difficult time getting back to work as she has to stand on her feet for the majority of the day, and is unable to do so.  Her friends are helping her out at the hair salon.  From an ambulatory perspective, she was unable to make it to my office without stopping several times, once in the parking lot once entering the building.  States that she has some crampiness, but mainly thigh weakness.  She also has bilateral lower extremity neuropathy and she is worried about some discoloration of the left great toe.  The weakness and aching that she describes begins in the buttocks and works its way down the legs.  The left leg is worse than the right.  The pt is not on a statin for cholesterol management - repatha The pt is not on a daily aspirin .   Other AC:  - The pt is on medication for hypertension.   The pt is not diabetic.  Tobacco hx:  former  Past Medical History:  Diagnosis Date   Anxiety    Arthritis    Cancer (HCC)    basal cell lesion on abdomen   Complication of anesthesia    hard to wake up, very jittery   Depression    Hypertension    PAD  (peripheral artery disease)     Past Surgical History:  Procedure Laterality Date   BREAST SURGERY Bilateral 2000   breast implants, augmentation  done at [facility]   HEMIARTHROPLASTY HIP Left 2021   done at [facility] SURGERY     bone graft to prepare for implant   TONSILLECTOMY     TRANSESOPHAGEAL ECHOCARDIOGRAM (CATH LAB) N/A 12/27/2023   Procedure: TRANSESOPHAGEAL ECHOCARDIOGRAM;  Surgeon: Francyne Headland, MD;  Location: [facility] INVASIVE CV LAB;  Service: Cardiovascular;  Laterality: N/A;   WISDOM TOOTH EXTRACTION      Social History   Socioeconomic History   Marital status: Single    Spouse name: Not on file   Number of children: Not on file   Years of education: Not on file   Highest education level: Not on file  Occupational History   Not on file  Tobacco Use   Smoking status: Former    Average packs/day: 0.5 packs/day for 34.7 years (17.4 ttl pk-yrs)    Types: Cigarettes    Start date: 62   Smokeless tobacco: Never  Vaping Use   Vaping status: Never Used  Substance and Sexual Activity   Alcohol use: Yes    Comment: social   Drug use: Not Currently   Sexual activity: Yes    Birth control/protection: Post-menopausal  Other Topics Concern   Not on file  Social History Narrative   Not on file   Social Drivers of Health   Tobacco Use: Medium Risk (04/11/2024)   Patient History    Smoking Tobacco Use: Former    Smokeless Tobacco Use: Never    Passive Exposure: Not on file  Financial Resource Strain: Not on file  Food Insecurity: Low Risk (03/27/2024)   Received from [facility]   Epic    Within the past 12 months, you worried that your food would run out before you got money to buy more: Never true    Within the past 12 months, the food you bought just didn't last and you didn't have money to get more. : Never true  Transportation Needs: No Transportation Needs (03/27/2024)   Received from [facility]    In the past 12 months,  has lack of reliable transportation kept you from medical appointments, meetings, work or from getting things needed for daily living? : No  Physical Activity: Not on file  Stress: Not on file  Social Connections: Moderately Isolated (12/26/2023)   Social Connection and Isolation Panel    Frequency of Communication with Friends and Family: Once a week    Frequency of Social Gatherings with Friends and Family: Once a week    Attends Religious Services: 1 to 4 times per year    Active Member of [employer] or Organizations: Yes    Attends Banker Meetings: 1 to 4 times per year    Marital Status: Divorced  Intimate Partner Violence: Not At Risk (12/26/2023)   Epic    Fear of Current or Ex-Partner: No    Emotionally Abused: No    Physically Abused: No    Sexually Abused: No  Depression (PHQ2-9): Not on file  Alcohol Screen: Not on file  Housing: Low Risk (03/27/2024)   Received from [facility]   Epic    What is your living situation today?: I have a steady place to live    Think about the place you live. Do you have problems with any of the following? Choose all that apply:: None/None on this list  Utilities: Low Risk (03/27/2024)   Received from [facility]   Utilities    In the past 12 months has the electric, gas, oil, or water company threatened to shut off services in your home? : No  Health Literacy: Not on file   Family History  Problem Relation Age of Onset   Breast cancer Sister 28       once and 79 & again at 56    Current Outpatient Medications  Medication Sig Dispense Refill   aspirin  EC 81 MG tablet Take 1 tablet (81 mg total) by mouth daily. Swallow whole. 90 tablet 0   clonazePAM  (KLONOPIN ) 1 MG tablet Take 1 mg by mouth daily as needed for anxiety.     DULoxetine  (CYMBALTA ) 30 MG capsule Take 1 capsule (30 mg total) by mouth daily. 30 capsule 6   ezetimibe  (ZETIA ) 10 MG tablet Take 1 tablet (10 mg total) by mouth daily. 90 tablet 0   fluticasone   (FLONASE ) 50 MCG/ACT nasal spray Place 2 sprays into both nostrils daily. (Patient taking differently: Place 2 sprays into both nostrils daily as needed for allergies or rhinitis.)     losartan  (COZAAR ) 50 MG tablet Take 50 mg by mouth daily. Take 25 mg by mouth in the morning and 50 mg in the evening (Patient taking differently: Take 25 mg by mouth daily.  Take 25 mg by mouth in the morning and 25 mg in the evening)     methimazole (TAPAZOLE) 5 MG tablet Take one tab po daily     metoprolol  succinate (TOPROL -XL) 25 MG 24 hr tablet Take 1 tablet (25 mg total) by mouth daily. 90 tablet 3   REPATHA SURECLICK 140 MG/ML SOAJ Inject 140 mg into the skin every 14 (fourteen) days.     No current facility-administered medications for this visit.    Allergies[1]   REVIEW OF SYSTEMS:  [X]  denotes positive finding, [ ]  denotes negative finding Cardiac  Comments:  Chest pain or chest pressure:    Shortness of breath upon exertion:    Short of breath when lying flat:    Irregular heart rhythm:        Vascular    Pain in calf, thigh, or hip brought on by ambulation:    Pain in feet at night that wakes you up from your sleep:     Blood clot in your veins:    Leg swelling:         Pulmonary    Oxygen at home:    Productive cough:     Wheezing:         Neurologic    Sudden weakness in arms or legs:     Sudden numbness in arms or legs:     Sudden onset of difficulty speaking or slurred speech:    Temporary loss of vision in one eye:     Problems with dizziness:         Gastrointestinal    Blood in stool:     Vomited blood:         Genitourinary    Burning when urinating:     Blood in urine:        Psychiatric    Major depression:         Hematologic    Bleeding problems:    Problems with blood clotting too easily:        Skin    Rashes or ulcers:        Constitutional    Fever or chills:      PHYSICAL EXAMINATION:  Vitals:   04/11/24 0915  BP: (!) 108/57  Pulse: 88   Resp: 18  Temp: 98.4 F (36.9 C)  TempSrc: Temporal  SpO2: 98%  Weight: 131 lb 9.6 oz (59.7 kg)  Height: 5' 6 (1.676 m)    General:  WDWN in NAD; vital signs documented above Gait: Not observed HENT: WNL, normocephalic Pulmonary: normal non-labored breathing , without wheezing Cardiac: regular HR Abdomen: soft, NT, no masses Skin: without rashes Vascular Exam/Pulses:  Right Left  Radial 2+ (normal) 2+ (normal)  Ulnar    Femoral    Popliteal    DP    PT     Extremities: without ischemic changes, without Gangrene , without cellulitis; without open wounds;  Musculoskeletal: no muscle wasting or atrophy  Neurologic: A&O X 3;  No focal weakness or paresthesias are detected Psychiatric:  The pt has Normal affect.   Non-Invasive Vascular Imaging:    +-------+-----------+-----------+------------+------------+  ABI/TBIToday's ABIToday's TBIPrevious ABIPrevious TBI  +-------+-----------+-----------+------------+------------+  Right 0.46       0.32                                 +-------+-----------+-----------+------------+------------+  Left  0.45       0.22                                 +-------+-----------+-----------+------------+------------+  ASSESSMENT/PLAN: Anapaula Severt is a 54 y.o. female presenting with progressive bilateral lower extremity pain beginning at the buttocks and working down the legs.  What initially began as claudication now is bothering her at night as well.   On physical exam, she had nonpalpable pedal pulses, nonpalpable femoral pulses.  ABI was reviewed demonstrating severely depressed waveforms bilaterally.  Severely depressed toe pressure.  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  She would benefit from CT angio with runoff to define the extent of occlusion versus stenosis for surgical  planning.  She is aware that the best thing she can do is remain active, continue to ambulate is much as possible. She is also aware that I think that her symptoms are likely multifactorial with a musculoskeletal component from the lumbosacral spine.  My plan is to see her back in the next 2 to 3 weeks to discuss her CT scan.  Fonda FORBES Rim, MD [facility] [phone]     [1]  Allergies Allergen Reactions   Sulfamethoxazole Itching   Atorvastatin      Muscle arm   Codeine Itching   Shellfish Allergy Swelling    Eyes swell shut   Strawberry (Diagnostic) Hives and Itching   "

## 2024-04-12 ENCOUNTER — Ambulatory Visit (HOSPITAL_BASED_OUTPATIENT_CLINIC_OR_DEPARTMENT_OTHER): Admission: RE | Admit: 2024-04-12 | Source: Ambulatory Visit | Admitting: Radiology

## 2024-04-12 DIAGNOSIS — I70223 Atherosclerosis of native arteries of extremities with rest pain, bilateral legs: Secondary | ICD-10-CM

## 2024-04-12 MED ORDER — IOHEXOL 350 MG/ML SOLN
125.0000 mL | Freq: Once | INTRAVENOUS | Status: AC | PRN
  Administered 2024-04-12: 125 mL via INTRAVENOUS

## 2024-04-16 ENCOUNTER — Ambulatory Visit: Admitting: Vascular Surgery

## 2024-04-19 ENCOUNTER — Encounter: Admitting: Neurology

## 2024-04-24 ENCOUNTER — Encounter: Admitting: Vascular Surgery

## 2024-04-25 ENCOUNTER — Ambulatory Visit: Admitting: Vascular Surgery

## 2024-04-29 ENCOUNTER — Ambulatory Visit: Admitting: Cardiology

## 2024-05-06 ENCOUNTER — Ambulatory Visit: Admitting: Neurology

## 2024-05-20 ENCOUNTER — Ambulatory Visit: Admitting: Diagnostic Neuroimaging
